# Patient Record
Sex: Male | Born: 1985 | Race: White | Hispanic: No | Marital: Married | Smoking: Current every day smoker
Health system: Southern US, Community
[De-identification: ages and names within clinical notes are randomized; demographics above are authoritative.]

## PROBLEM LIST (undated history)

## (undated) DIAGNOSIS — S8254XK Nondisplaced fracture of medial malleolus of right tibia, subsequent encounter for closed fracture with nonunion: Secondary | ICD-10-CM

## (undated) DIAGNOSIS — M25871 Other specified joint disorders, right ankle and foot: Secondary | ICD-10-CM

## (undated) DIAGNOSIS — M25371 Other instability, right ankle: Secondary | ICD-10-CM

## (undated) DIAGNOSIS — Z4789 Encounter for other orthopedic aftercare: Secondary | ICD-10-CM

## (undated) DIAGNOSIS — G709 Myoneural disorder, unspecified: Secondary | ICD-10-CM

## (undated) DIAGNOSIS — M549 Dorsalgia, unspecified: Secondary | ICD-10-CM

## (undated) DIAGNOSIS — K921 Melena: Secondary | ICD-10-CM

## (undated) DIAGNOSIS — Z87442 Personal history of urinary calculi: Secondary | ICD-10-CM

## (undated) DIAGNOSIS — G8929 Other chronic pain: Secondary | ICD-10-CM

## (undated) DIAGNOSIS — K589 Irritable bowel syndrome without diarrhea: Secondary | ICD-10-CM

## (undated) DIAGNOSIS — I10 Essential (primary) hypertension: Secondary | ICD-10-CM

## (undated) DIAGNOSIS — K635 Polyp of colon: Secondary | ICD-10-CM

## (undated) DIAGNOSIS — J45909 Unspecified asthma, uncomplicated: Secondary | ICD-10-CM

## (undated) DIAGNOSIS — K219 Gastro-esophageal reflux disease without esophagitis: Secondary | ICD-10-CM

## (undated) HISTORY — DX: Polyp of colon: K63.5

## (undated) HISTORY — DX: Gastro-esophageal reflux disease without esophagitis: K21.9

## (undated) HISTORY — PX: KNEE SURGERY: SHX244

## (undated) HISTORY — DX: Melena: K92.1

## (undated) HISTORY — PX: GALLBLADDER SURGERY: SHX652

---
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
## 2004-07-20 ENCOUNTER — Emergency Department (HOSPITAL_COMMUNITY): Admission: EM | Admit: 2004-07-20 | Discharge: 2004-07-20 | Payer: Self-pay | Admitting: Emergency Medicine

## 2004-08-29 ENCOUNTER — Emergency Department (HOSPITAL_COMMUNITY): Admission: EM | Admit: 2004-08-29 | Discharge: 2004-08-29 | Payer: Self-pay | Admitting: Emergency Medicine

## 2004-12-25 ENCOUNTER — Emergency Department (HOSPITAL_COMMUNITY): Admission: EM | Admit: 2004-12-25 | Discharge: 2004-12-25 | Payer: Self-pay | Admitting: Emergency Medicine

## 2005-05-14 ENCOUNTER — Emergency Department (HOSPITAL_COMMUNITY): Admission: EM | Admit: 2005-05-14 | Discharge: 2005-05-14 | Payer: Self-pay | Admitting: Emergency Medicine

## 2005-07-31 ENCOUNTER — Emergency Department (HOSPITAL_COMMUNITY): Admission: EM | Admit: 2005-07-31 | Discharge: 2005-07-31 | Payer: Self-pay | Admitting: Emergency Medicine

## 2005-09-05 IMAGING — CR DG SHOULDER 2+V*R*
3 series · 3 of 3 positions shown · non-contrast
Comparison: none

CLINICAL DATA: Rt arm injury.
 RIGHT SHOULDER THREE VIEWS
 Comparison none. 
 There is no evidence of an acute fracture or dislocation.  Subacromial space appears well preserved.  The acromioclavicular joint is intact.  There is lateral slope to the acromion which may put the patient at risk for impingement. 
 IMPRESSION
 No acute skeletal abnormality.  Lateral slope to the acromion which may put the patient at risk for impingement. 
 RIGHT HUMERUS TWO VIEWS
 No fracture is identified.  There are no intrinsic osseous abnormalities. 
 Normal examination. 
 RIGHT ELBOW FOUR VIEWS
 There is no evidence of an acute fracture or dislocation.  The joint spaces are well preserved.  There are no intrinsic osseous abnormalities.  There is no posterior fat pad to suggest an occult fracture. 
 Normal examination.

[view not recorded (1 of 3)]
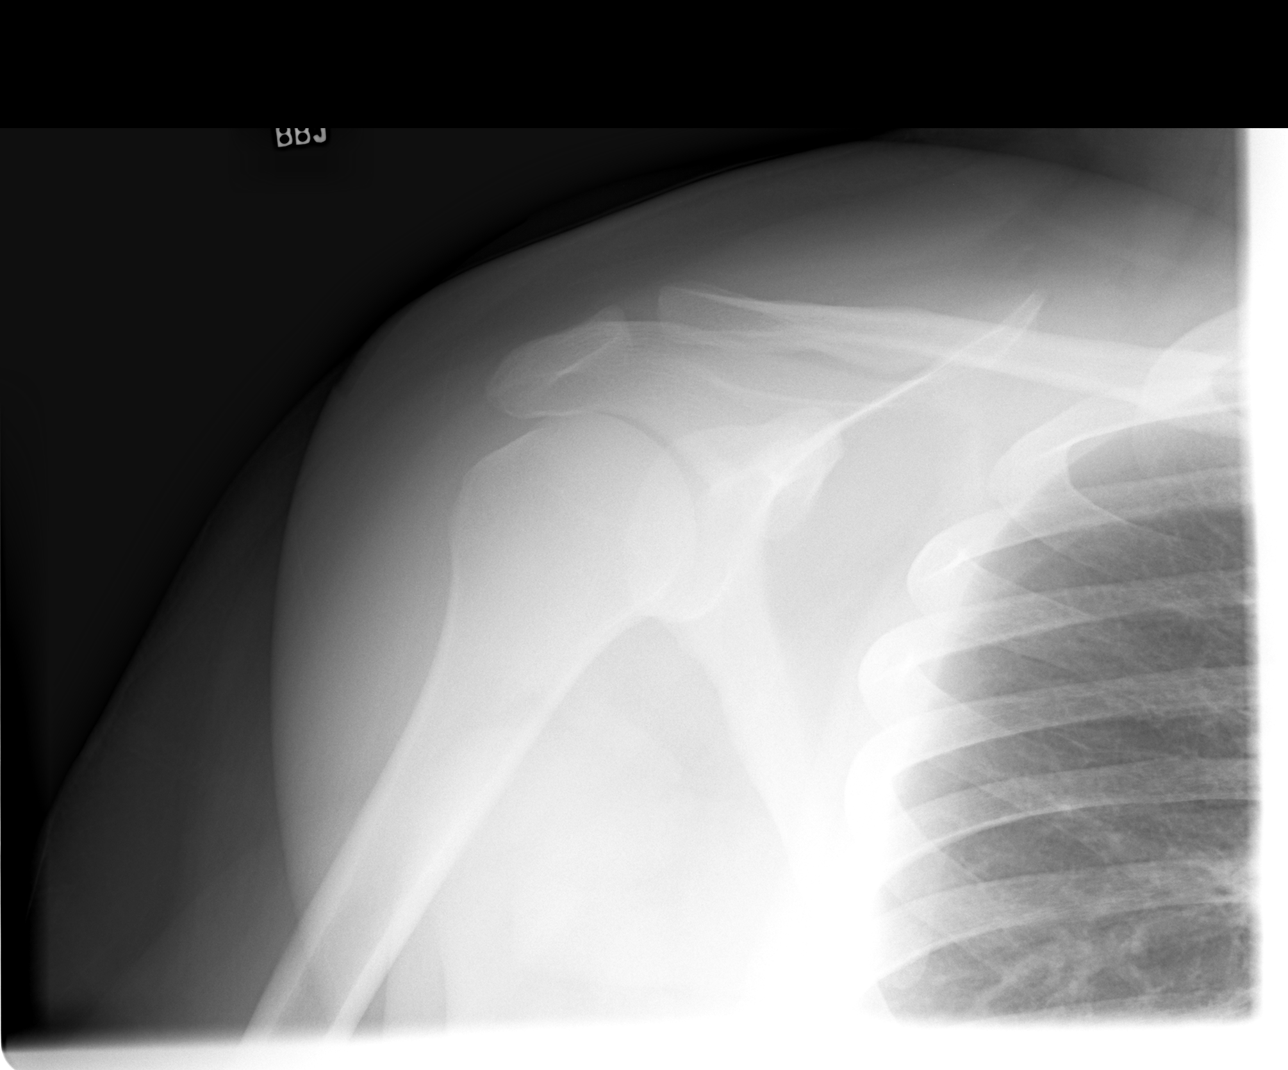

[view not recorded (2 of 3)]
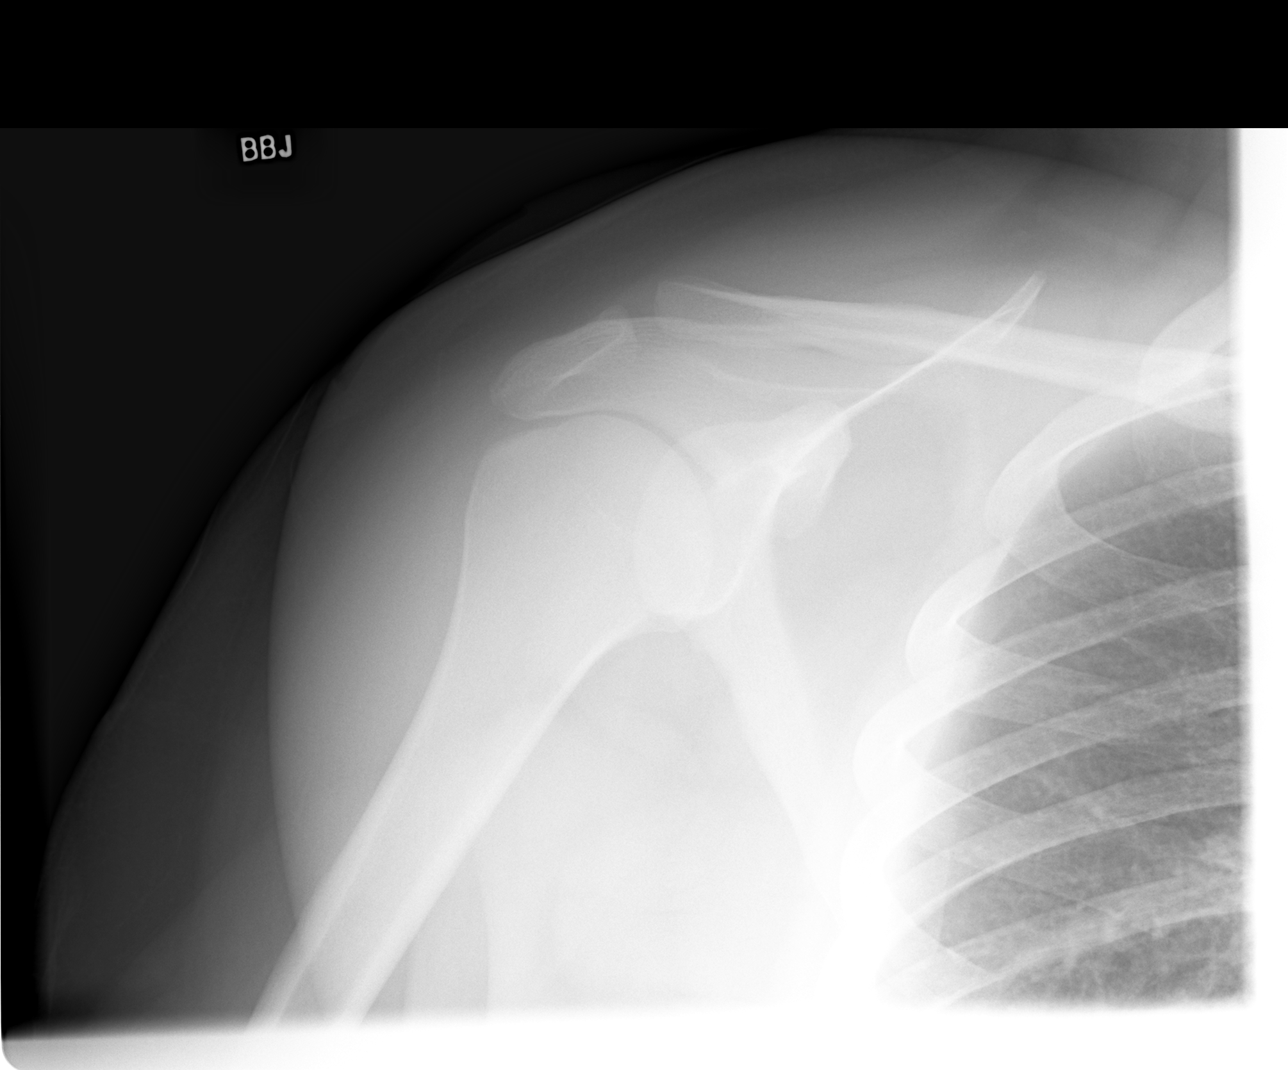

[view not recorded (3 of 3)]
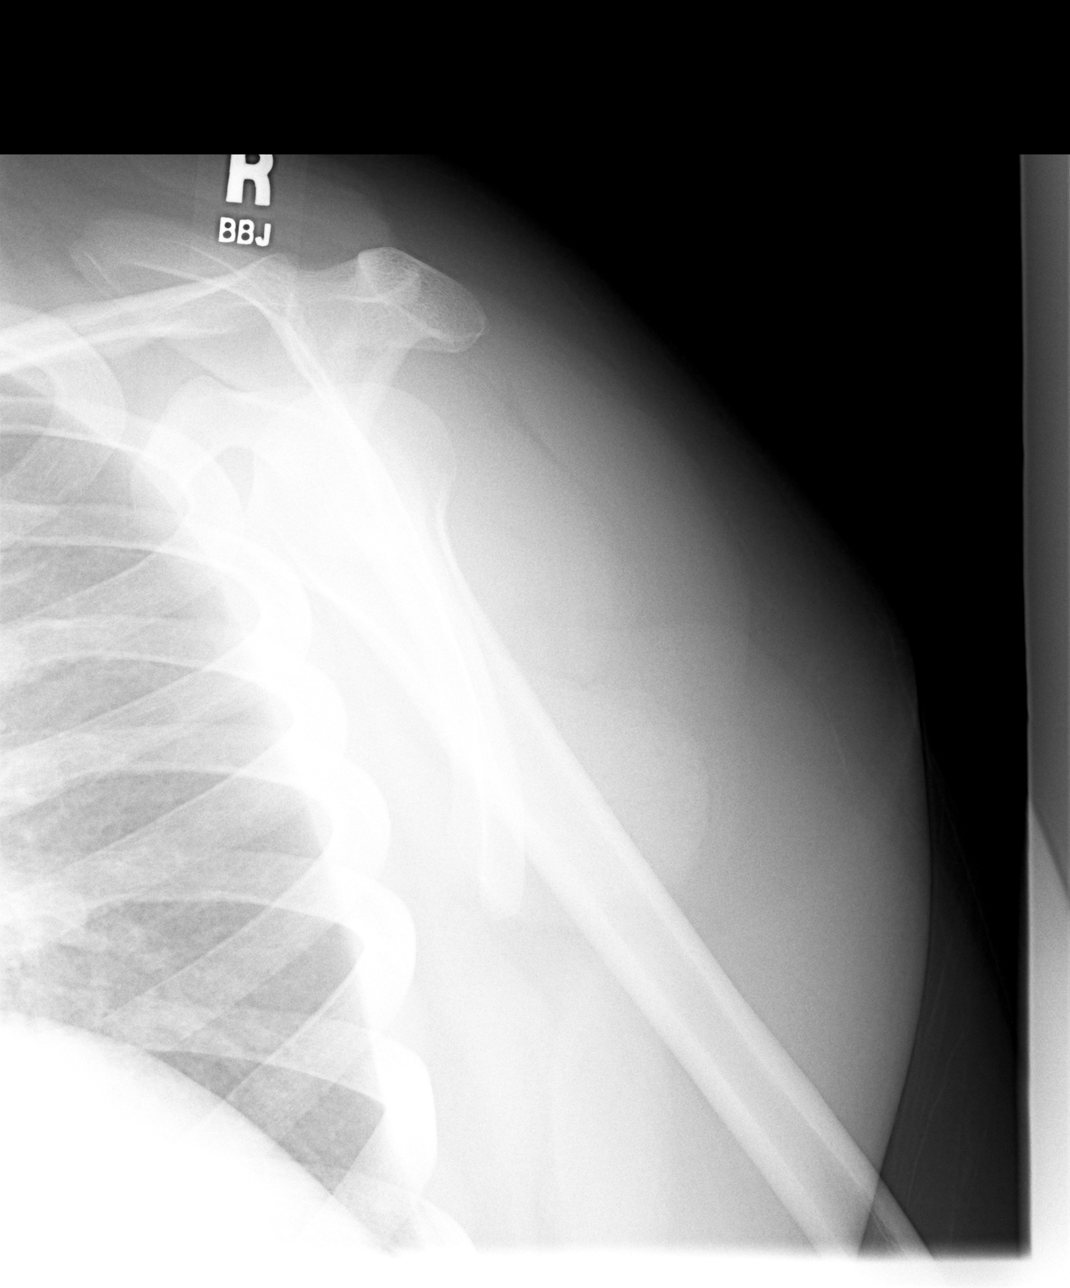

[3 of 3 positions shown; findings below may reference images not displayed]

## 2005-09-05 IMAGING — CR DG HUMERUS 2V *R*
2 series · 2 of 2 positions shown · non-contrast
Comparison: none

CLINICAL DATA: Rt arm injury.
 RIGHT SHOULDER THREE VIEWS
 Comparison none. 
 There is no evidence of an acute fracture or dislocation.  Subacromial space appears well preserved.  The acromioclavicular joint is intact.  There is lateral slope to the acromion which may put the patient at risk for impingement. 
 IMPRESSION
 No acute skeletal abnormality.  Lateral slope to the acromion which may put the patient at risk for impingement. 
 RIGHT HUMERUS TWO VIEWS
 No fracture is identified.  There are no intrinsic osseous abnormalities. 
 Normal examination. 
 RIGHT ELBOW FOUR VIEWS
 There is no evidence of an acute fracture or dislocation.  The joint spaces are well preserved.  There are no intrinsic osseous abnormalities.  There is no posterior fat pad to suggest an occult fracture. 
 Normal examination.

[view not recorded (1 of 2)]
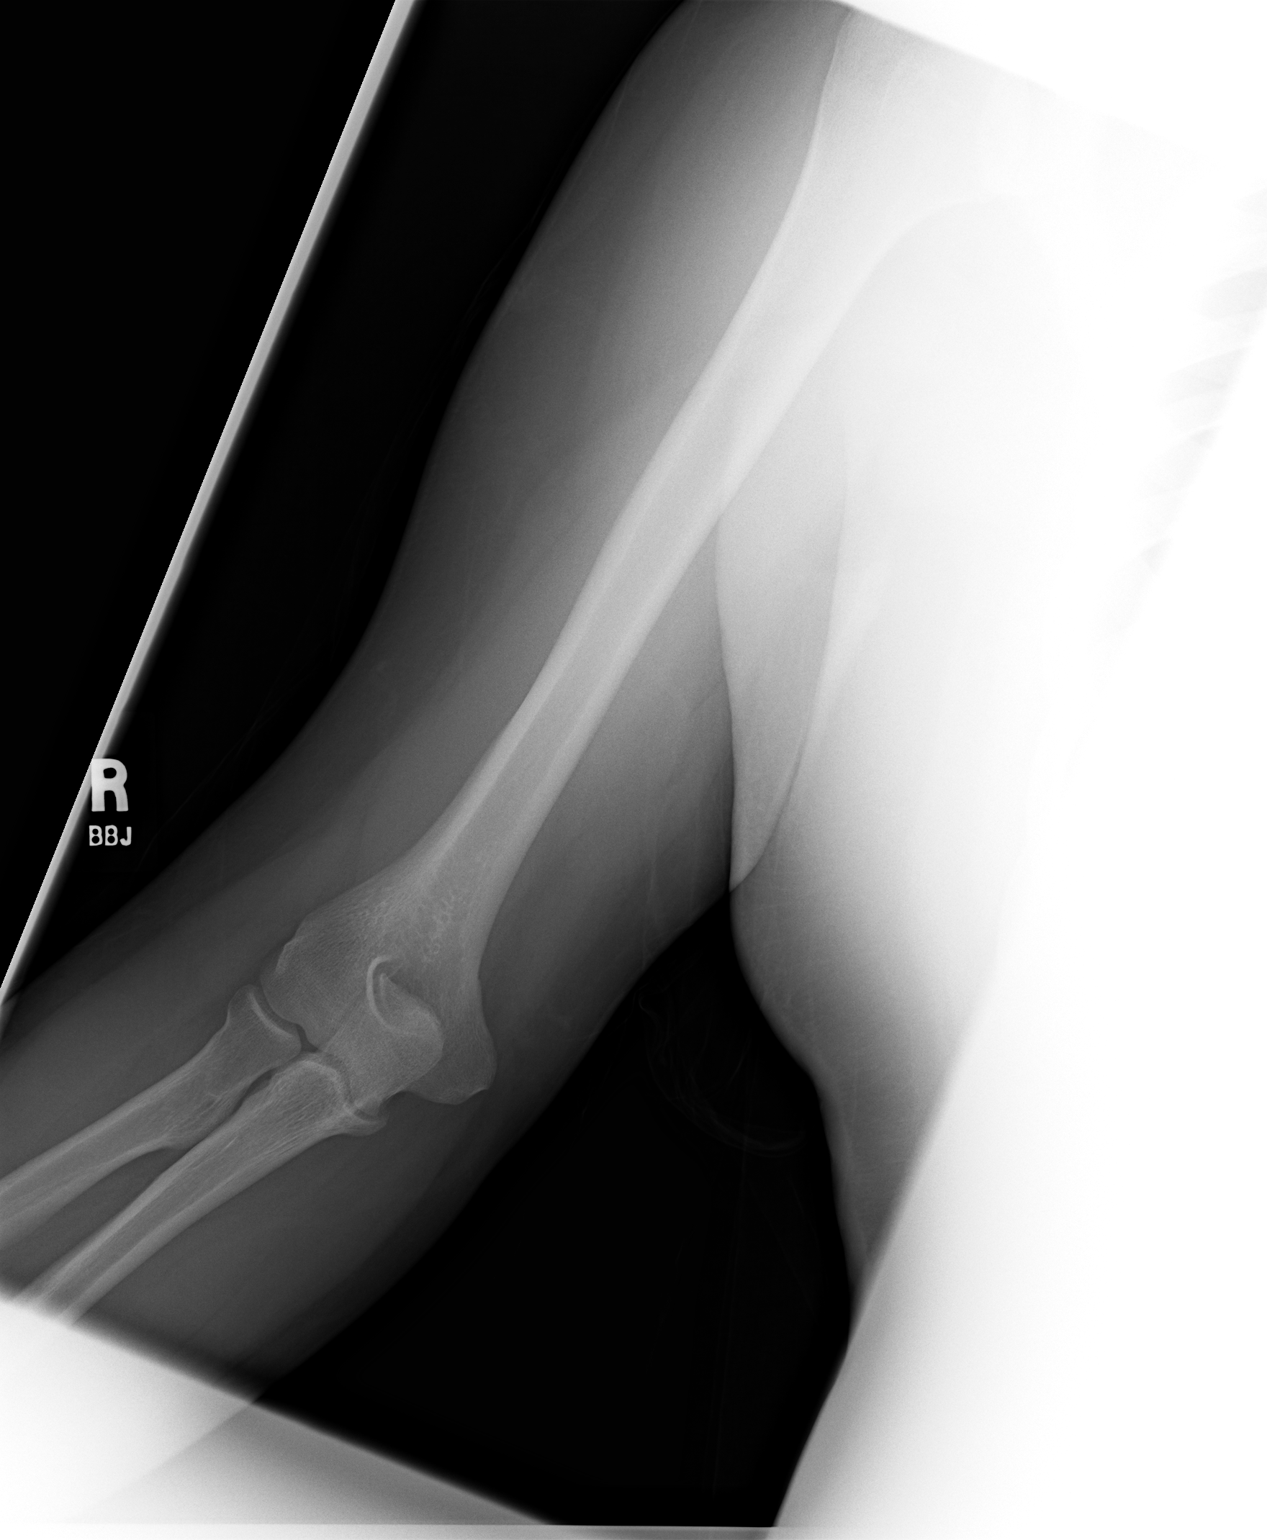

[view not recorded (2 of 2)]
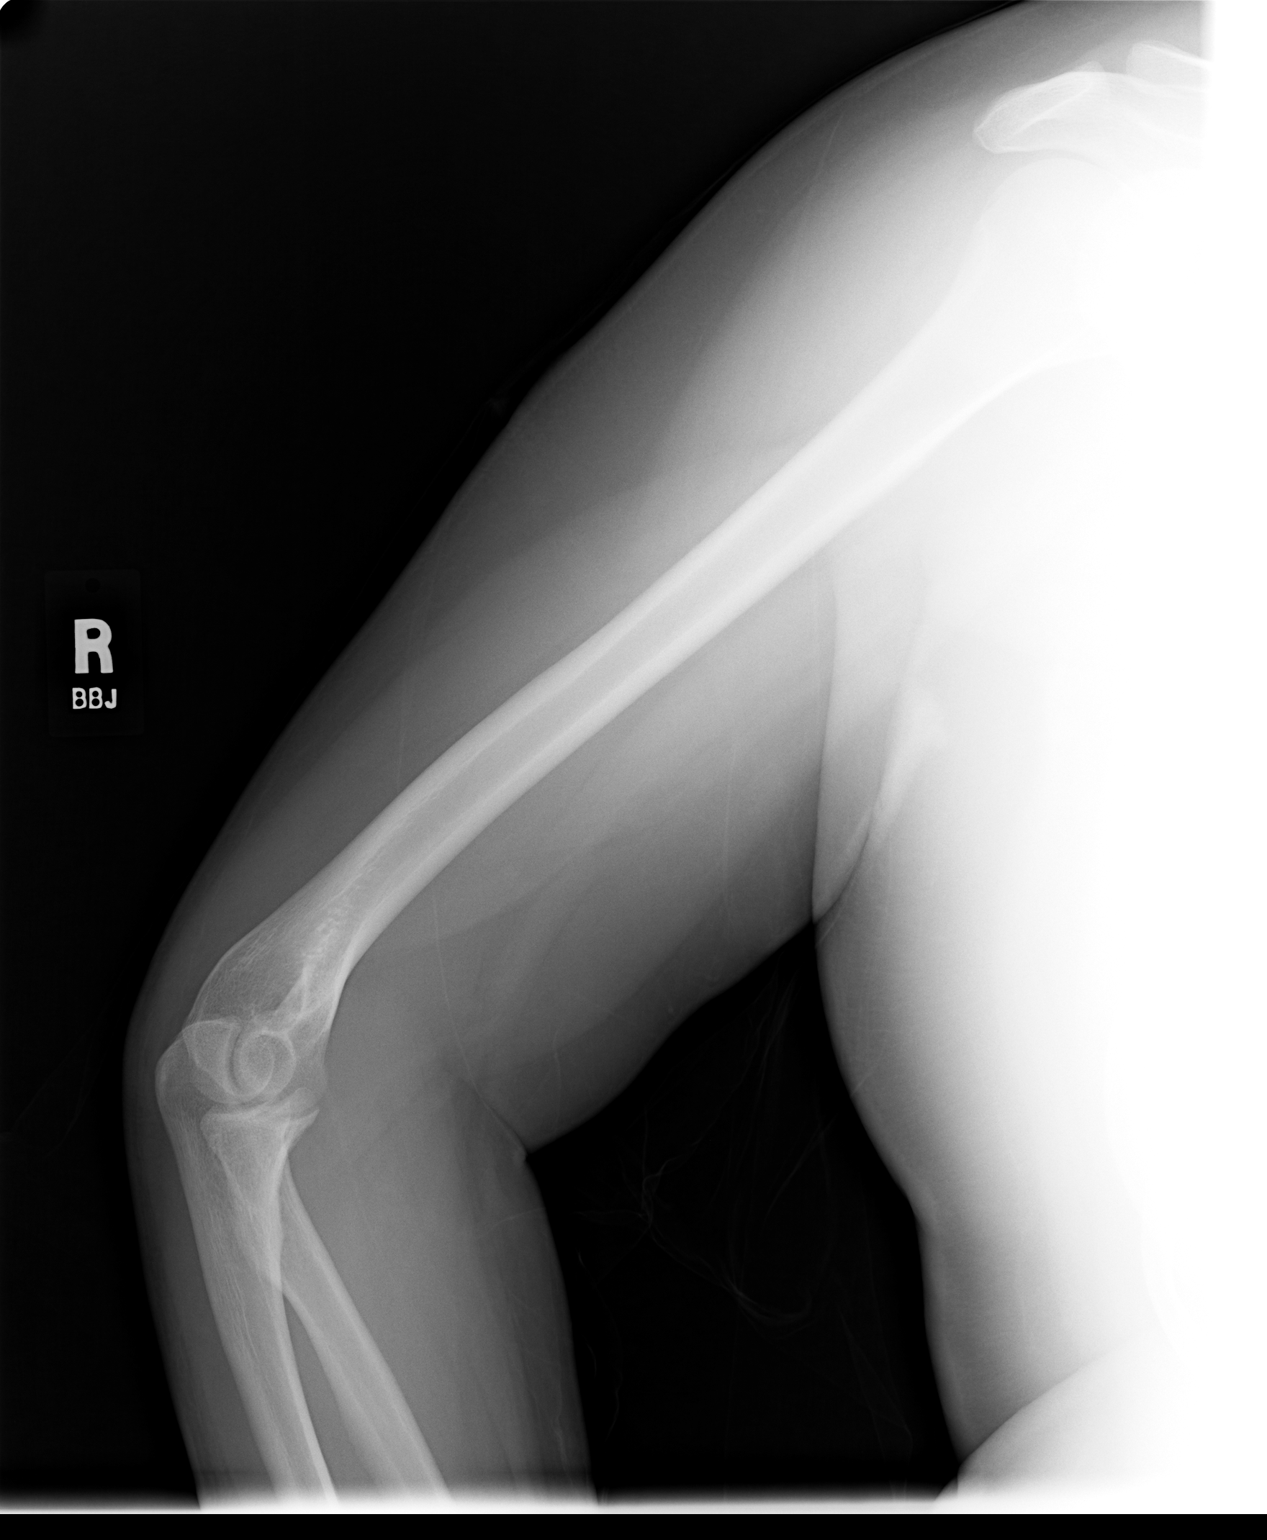

[2 of 2 positions shown; findings below may reference images not displayed]

## 2006-02-04 ENCOUNTER — Emergency Department (HOSPITAL_COMMUNITY): Admission: EM | Admit: 2006-02-04 | Discharge: 2006-02-04 | Payer: Self-pay | Admitting: Emergency Medicine

## 2006-06-08 ENCOUNTER — Emergency Department (HOSPITAL_COMMUNITY): Admission: EM | Admit: 2006-06-08 | Discharge: 2006-06-08 | Payer: Self-pay | Admitting: Emergency Medicine

## 2006-09-04 ENCOUNTER — Emergency Department (HOSPITAL_COMMUNITY): Admission: EM | Admit: 2006-09-04 | Discharge: 2006-09-04 | Payer: Self-pay | Admitting: Emergency Medicine

## 2006-10-01 ENCOUNTER — Emergency Department (HOSPITAL_COMMUNITY): Admission: EM | Admit: 2006-10-01 | Discharge: 2006-10-01 | Payer: Self-pay | Admitting: Emergency Medicine

## 2006-10-31 ENCOUNTER — Emergency Department (HOSPITAL_COMMUNITY): Admission: EM | Admit: 2006-10-31 | Discharge: 2006-10-31 | Payer: Self-pay | Admitting: Emergency Medicine

## 2007-01-04 ENCOUNTER — Emergency Department (HOSPITAL_COMMUNITY): Admission: EM | Admit: 2007-01-04 | Discharge: 2007-01-04 | Payer: Self-pay | Admitting: Emergency Medicine

## 2007-09-01 ENCOUNTER — Emergency Department (HOSPITAL_COMMUNITY): Admission: EM | Admit: 2007-09-01 | Discharge: 2007-09-01 | Payer: Self-pay | Admitting: Emergency Medicine

## 2007-12-15 ENCOUNTER — Emergency Department (HOSPITAL_COMMUNITY): Admission: EM | Admit: 2007-12-15 | Discharge: 2007-12-15 | Payer: Self-pay | Admitting: Emergency Medicine

## 2009-01-23 ENCOUNTER — Emergency Department (HOSPITAL_COMMUNITY): Admission: EM | Admit: 2009-01-23 | Discharge: 2009-01-23 | Payer: Self-pay | Admitting: Emergency Medicine

## 2009-01-24 ENCOUNTER — Ambulatory Visit: Payer: Self-pay | Admitting: Family Medicine

## 2009-01-30 ENCOUNTER — Ambulatory Visit: Payer: Self-pay | Admitting: Family Medicine

## 2009-05-04 ENCOUNTER — Emergency Department (HOSPITAL_COMMUNITY): Admission: EM | Admit: 2009-05-04 | Discharge: 2009-05-04 | Payer: Self-pay | Admitting: Emergency Medicine

## 2010-05-27 LAB — URINALYSIS, ROUTINE W REFLEX MICROSCOPIC
Glucose, UA: NEGATIVE mg/dL
Hgb urine dipstick: NEGATIVE
Specific Gravity, Urine: 1.012 (ref 1.005–1.030)

## 2010-06-05 LAB — URINALYSIS, ROUTINE W REFLEX MICROSCOPIC
Glucose, UA: NEGATIVE mg/dL
Protein, ur: NEGATIVE mg/dL
Specific Gravity, Urine: 1.02 (ref 1.005–1.030)
pH: 6.5 (ref 5.0–8.0)

## 2010-07-19 NOTE — H&P (Signed)
NAME:  Antonio Griffith, MINOTTI                         ACCOUNT NO.:  [id number]   MEDICAL RECORD NO.:  [id number]                   PATIENT TYPE:   LOCATION:                                       FACILITY:  [facility]   PHYSICIAN:  J. Darreld Mclean, M.D.              DATE OF BIRTH:  March 03, 1986   DATE OF ADMISSION:  11/09/2003  DATE OF DISCHARGE:                                HISTORY & PHYSICAL   CHIEF COMPLAINT:  Right knee pain.   HISTORY OF PRESENT ILLNESS:  The patient is an 25 year old white male who is  being admitted on November 09, 2003 to undergo right knee arthroscopy and  meniscectomy.   He was initially seen here in our office on October 31, 2003 complaining of  right knee pain.  He had a history of giving away and locking of the knee.  He also stated that he had a massive ___________ approximately six months  ago when he fell back on his knee and twisted it.  Since then he has been to  the emergency room twice and had x-rays taken.  Each of the times showed an  effusion to the knee and possibly a loose body.   Because of the giving away and locking of his knee and x-ray findings, it  was felt the patient had a meniscal tear.  He underwent an MRI of his right  knee on October 31, 2003 and did indeed show a lateral meniscal tear with  protrusion of the lateral meniscus through the joint space.  There are also  some changes noted to the anterior horn of the lateral meniscus.  There was  a complete versus a high-grade partial tear of the proximal anterior  cruciate ligament noted on the right knee.  The results of these studies  were explained to the patient using a knee model.  Recommendations was a  knee arthroscopy and meniscectomy.   I explained the procedure to the patient as well as his parents and gave him  an information booklet.  He understands that following the procedure, he  cannot squat, engage in any activity that would reaggravate the meniscus for  approximately six  weeks.  He also understands he may need a short course of  physical therapy.  The patient as well as his parents were instructed that  if he did indeed have full-thickness ACL tear, he may need a second surgery  to correct this.   PAST MEDICAL HISTORY:  The patient denies any diabetes mellitus,  hypertension, stroke, cardiac arrest, stroke problems.   PAST SURGICAL HISTORY:  None.   MEDICATIONS:  None.   ALLERGIES:  None.   LOCAL MEDICAL DOCTOR:  None.   FAMILY AND SOCIAL HISTORY:  The patient is now currently getting a GED and  does not live with his parents.  He smokes half a pack of cigarettes per  day.  He states he does  not drink alcohol.  He works at [employer]  here in [place].   REVIEW OF SYSTEMS:  Negative for this problem.   PHYSICAL EXAMINATION:  GENERAL:  Height 5 feet 6 inches tall, weight 240  pounds. Alert and oriented x 3.  VITAL SIGNS:  Pulse 110, respirations 12, blood pressure 110/70.  HEENT:  Normal.  NECK:  Supple.  No thyromegaly, masses, palpated.  LUNGS:  Clear to A&P.  HEART:  Regular rhythm.  No murmur, no cardiomegaly.  ABDOMEN:  Protuberant, obese, soft, and nontender.  No organomegaly or  masses palpated.  Hyperactive bowel sounds are auscultated.  EXTREMITIES:  There is effusion to the right knee with marked pain and  tenderness to the lateral joint line on palpation.  There is no medial or  lateral collateral ligament instability noted. Negative anterior drawer  sign.  Neurovascular is intact.  Gait was normal.  The other extremity was normal  and neurovascular intact.  CNS:  Intact.  SKIN:  Intact.   IMPRESSION:  Torn posterior horn lateral meniscus with protrusion through  the knee joint.  Complete versus partial tear to the anterior cruciate  ligament, right knee.  Exogenous obesity.   PLAN:  The patient is to be admitted to day hospital on November 09, 2003 to  undergo right knee arthroscopy and meniscectomy.  Labs are  pending.     _____________________________________  ___________________________________________  Candace Cruise, Doylene Bode, M.D.   BB/MEDQ  D:  11/02/2003  T:  11/02/2003  Job:  [id number]

## 2010-07-19 NOTE — Op Note (Signed)
NAME:  Antonio Griffith, Antonio Griffith                         ACCOUNT NO.:  [id number]   MEDICAL RECORD NO.:  [id number]                   PATIENT TYPE:  AMB   LOCATION:  DAY                                  FACILITY:  [facility]   PHYSICIAN:  J. Darreld Mclean, M.D.              DATE OF BIRTH:  04-Jan-1986   DATE OF PROCEDURE:  11/09/2003  DATE OF DISCHARGE:                                 OPERATIVE REPORT   PREOPERATIVE DIAGNOSIS:  Tear lateral meniscus, right knee.   POSTOPERATIVE DIAGNOSIS:  Tear medial meniscus, right knee.   PROCEDURE:  Operative arthroscopy, partial medial meniscectomy.   ANESTHESIA:  General.   SURGEON:  J. Darreld Mclean, M.D.   ASSISTANT:  Candace Cruise, P.A.-C.   TOURNIQUET TIME:  22 minutes.   DRAINS:  No drains.   INDICATIONS:  The patient is an 25 year old male with pain and tenderness in  his right knee that has been getting worse. A MRI showed a tear of the  lateral meniscus and a partial tear of the anterior cruciate ligament. The  patient has not improved with conservative treatment. Surgery is  recommended. Risks and imponderables were discussed.   OPERATIVE FINDINGS:  Suprapatellar pouch looked normal, __________ patella  looked normal. Medially, there was a tear in the posterior horn of the  medical meniscus. The cartilage looked normal. Anterior cruciate was intact  laterally. There was no tear laterally, and the articular surface looked  normal. Gutters were negative. No loose bodies.   DESCRIPTION OF PROCEDURE:  The patient was seen in the holding area. The  right knee was identified at the correct surgical site. I placed an initial  over the surgical wound area. The patient had already placed his mark. The  patient was brought back to the operating room and given general anesthesia.  Leg holder and tourniquet placed deflated on the right upper thigh. The  patient was prepped and draped in the usual manner. Had a time out  identifying the right knee as the  correct surgical site. The leg was  elevated and wrapped circumferentially with Esmarch bandage, tourniquet  inflated 300 mmHg, and Esmarch bandage removed. Inflow cannula inserted.  Lactated ringers instilled in the knee by an effusion pump. Arthroscope  inserted laterally. Arthroscope systematically used around the knee. Please  see findings above. It should be noted on the MRI said there was a lateral  tear; lateral meniscus was normal. MRI said possible incomplete or complete  tear of the anterior cruciate; anterior cruciate looked normal. There was a  tear in the posterior horn of the medial meniscus. Using a meniscal shaver  and meniscal punch, a smooth contour was obtained after removing the tear.  Knee was systematically reexamined; no new pathology found. Particularly  went and looked laterally again and did not appreciate a tear here. Anterior  cruciate was intact. He was irrigated with remaining lactated ringers. Wound  was reapproximated using  3-0 nylon interrupted vertical mattress.  Pneumatic  tourniquet was deflated after 22 minutes. Marcaine 0.25% instilled in each  portal. A sterile dressing applied. A bulky dressing applied. Knee  immobilizer. The patient tolerated the procedure well and went to recovery  in good condition. Prescription for Vicodin ES given for pain. I will see in  the office in approximately 10 days to 2 weeks. Any difficulties, contact us  through provided numbers.      ___________________________________________                                            Antonio Griffith, M.D.   JWK/MEDQ  D:  11/09/2003  T:  11/09/2003  Job:  [id number]

## 2011-05-14 ENCOUNTER — Emergency Department (HOSPITAL_COMMUNITY)
Admission: EM | Admit: 2011-05-14 | Discharge: 2011-05-14 | Disposition: A | Discharge status: Home / Self Care | Payer: BC Managed Care – PPO | Attending: Emergency Medicine | Admitting: Emergency Medicine

## 2011-05-14 ENCOUNTER — Encounter (HOSPITAL_COMMUNITY): Payer: Self-pay | Admitting: *Deleted

## 2011-05-14 ENCOUNTER — Emergency Department (HOSPITAL_COMMUNITY): Payer: BC Managed Care – PPO

## 2011-05-14 DIAGNOSIS — M79609 Pain in unspecified limb: Secondary | ICD-10-CM | POA: Insufficient documentation

## 2011-05-14 DIAGNOSIS — M25539 Pain in unspecified wrist: Secondary | ICD-10-CM | POA: Insufficient documentation

## 2011-05-14 DIAGNOSIS — W010XXA Fall on same level from slipping, tripping and stumbling without subsequent striking against object, initial encounter: Secondary | ICD-10-CM | POA: Insufficient documentation

## 2011-05-14 DIAGNOSIS — R609 Edema, unspecified: Secondary | ICD-10-CM | POA: Insufficient documentation

## 2011-05-14 DIAGNOSIS — S63509A Unspecified sprain of unspecified wrist, initial encounter: Secondary | ICD-10-CM | POA: Insufficient documentation

## 2011-05-14 DIAGNOSIS — M25439 Effusion, unspecified wrist: Secondary | ICD-10-CM | POA: Insufficient documentation

## 2011-05-14 DIAGNOSIS — Y93E1 Activity, personal bathing and showering: Secondary | ICD-10-CM | POA: Insufficient documentation

## 2011-05-14 MED ORDER — IBUPROFEN 800 MG PO TABS
800.0000 mg | ORAL_TABLET | Freq: Once | ORAL | Status: AC
  Administered 2011-05-14: 800 mg via ORAL
  Filled 2011-05-14: qty 1

## 2011-05-14 MED ORDER — IBUPROFEN 800 MG PO TABS: 800.0000 mg | ORAL_TABLET | Freq: Three times a day (TID) | ORAL | Status: AC

## 2011-05-14 MED ORDER — HYDROCODONE-ACETAMINOPHEN 5-325 MG PO TABS: ORAL_TABLET | ORAL | Status: AC

## 2011-05-14 MED ORDER — HYDROCODONE-ACETAMINOPHEN 5-325 MG PO TABS
1.0000 | ORAL_TABLET | Freq: Once | ORAL | Status: AC
  Administered 2011-05-14: 1 via ORAL
  Filled 2011-05-14: qty 1

## 2011-05-14 NOTE — ED Notes (Signed)
Patient transported to X-ray 

## 2011-05-14 NOTE — ED Provider Notes (Signed)
History     CSN: [id number]  Arrival date & time 05/14/11  2150   First MD Initiated Contact with Patient 05/14/11 2220      Chief Complaint  Patient presents with  . Fall    (Consider location/radiation/quality/duration/timing/severity/associated sxs/prior treatment) HPI Comments: Patient c/o pain to his right wrist and forearm that began after he slipped and fell in the shower.  States his wrist struck the edge of the tub when he fell.  He denies other injuries, headache, dizziness, vomiting or LOC.  Patient is a 26 y.o. male presenting with wrist pain. The history is provided by the patient. No language interpreter was used.  Wrist Pain This is a new problem. Episode onset: just prior to ED arrival. The problem occurs constantly. The problem has been unchanged. Associated symptoms include arthralgias and joint swelling. Pertinent negatives include no abdominal pain, chest pain, fever, headaches, nausea, neck pain, numbness, vomiting or weakness. Associated symptoms comments: No head injury, neck pain or LOC. Exacerbated by: movement and palpation. He has tried ice for the symptoms. The treatment provided mild relief.    History reviewed. No pertinent past medical history.  Past Surgical History  Procedure Date  . Knee surgery     History reviewed. No pertinent family history.  History  Substance Use Topics  . Smoking status: Current Everyday Smoker  . Smokeless tobacco: Not on file  . Alcohol Use: Yes      Review of Systems  Constitutional: Negative for fever.  HENT: Negative for facial swelling and neck pain.   Cardiovascular: Negative for chest pain.  Gastrointestinal: Negative for nausea, vomiting and abdominal pain.  Genitourinary: Negative for hematuria and flank pain.  Musculoskeletal: Positive for joint swelling and arthralgias. Negative for back pain.  Skin: Negative.   Neurological: Negative for dizziness, weakness, numbness and headaches.  All other  systems reviewed and are negative.    Allergies  Review of patient's allergies indicates no known allergies.  Home Medications   Current Outpatient Rx  Name Route Sig Dispense Refill  . OXYMETAZOLINE HCL 0.05 % NA SOLN Nasal Place 2 sprays into the nose 2 (two) times daily.      BP 147/93  Pulse 84  Temp(Src) 97.9 F (36.6 C) (Oral)  Resp 16  Ht 5\' 9"  (1.753 m)  Wt 310 lb (140.615 kg)  BMI 45.78 kg/m2  SpO2 94%  Physical Exam  Nursing note and vitals reviewed. Constitutional: He is oriented to person, place, and time. He appears well-developed and well-nourished. No distress.  HENT:  Head: Normocephalic and atraumatic.  Mouth/Throat: Oropharynx is clear and moist.  Eyes: EOM are normal.  Neck: Normal range of motion. Neck supple.  Cardiovascular: Normal rate, regular rhythm, normal heart sounds and intact distal pulses.   No murmur heard. Pulmonary/Chest: Effort normal and breath sounds normal. No respiratory distress. He exhibits no tenderness.  Abdominal: Soft. He exhibits no distension. There is no tenderness.  Musculoskeletal: Normal range of motion. He exhibits edema and tenderness.       Right wrist: He exhibits tenderness, bony tenderness and swelling. He exhibits normal range of motion, no effusion, no crepitus, no deformity and no laceration.       Arms:      Diffuse ttp of the right wrist.  Mild STS, sensation intact, radial pulse is brisk, cr<2 sec.  Elbow is NT. Pain to wrist  is reproduced with flexion  Lymphadenopathy:    He has no cervical adenopathy.  Neurological: He is  alert and oriented to person, place, and time. He exhibits normal muscle tone. Coordination normal.  Skin: Skin is warm and dry.    ED Course  Procedures (including critical care time)  Labs Reviewed - No data to display Dg Wrist Complete Right  05/14/2011  *RADIOLOGY REPORT*  Clinical Data: Right hand and wrist pain status post fall.  RIGHT WRIST - COMPLETE 3+ VIEW  Comparison:  Contemporaneous hand  Findings: No acute fracture or dislocation.  No aggressive osseous lesion.  IMPRESSION: No acute osseous abnormality identified.  Original Report Authenticated By: Waneta Martins, M.D.   Dg Hand Complete Right  05/14/2011  *RADIOLOGY REPORT*  Clinical Data: Right hand and wrist pain status post fall.  RIGHT HAND - COMPLETE 3+ VIEW  Comparison: Contemporaneous wrist  Findings: No displaced fracture or dislocation identified.  No aggressive osseous lesion.  IMPRESSION: No acute osseous abnormality identified.  If clinical concern for a fracture persists, recommend a repeat radiograph in 5-10 days to evaluate for interval change or callus formation.  Original Report Authenticated By: Waneta Martins, M.D.    velcro forearm splint applied.  Pain improved.  Remains NV intact.      MDM    ttp of the right wrist , w/o obvious deformity.  NV is intact.  No abrasion, bruising or erythema.  Pt agrees to elevate and ice, I will give referral for orthopedic f/u and pt agrees to care plan.  Patient / Family / Caregiver understand and agree with initial ED impression and plan with expectations set for ED visit. Pt stable in ED with no significant deterioration in condition. Pt feels improved after observation and/or treatment in ED.        Bayli Quesinberry L. [place], [place] 05/17/11 2131

## 2011-05-14 NOTE — Discharge Instructions (Signed)
Sprain  A sprain happens when the bands of tissue that connect bones and hold joints together (ligaments) stretch too much or tear.  HOME CARE   Raise (elevate) the injured area to lessen puffiness (swelling).   Put ice on the injured area.   Put ice in a plastic bag.   Place a towel between your skin and the bag.   Leave the ice on for 15 to 20 minutes, 3 to 4 times a day.   Do this for the first 24 hours or as told by your doctor.   Wear any splints, braces, castings, or elastic wraps as told by your child's doctor.   Eat healthy foods.   Only take medicine as told by your doctor.  GET HELP RIGHT AWAY IF:    There is numbness or tingling in the injured limb.   The toes or fingers become blue or white in the injured limb.   The sprained limb is cold to the touch.   There is a sharp, shooting pain in the injured limb.   The puffiness is getting worse instead of better.  MAKE SURE YOU:    Understand these instructions.   Will watch this condition.   Will get help right away if you are not doing well or get worse.  Document Released: 08/06/2007 Document Revised: 02/06/2011 Document Reviewed: 01/03/2009  [employer] Patient Information 2012 [employer].

## 2011-05-14 NOTE — ED Notes (Signed)
PA at bedside to evaluate.

## 2011-05-14 NOTE — ED Notes (Signed)
Slipped in shower.  Pain rt hand, wrist, forearm.

## 2011-05-14 NOTE — ED Notes (Signed)
Patient back to room from radiology. Into room to assess. Slight swelling to right hand. No deformity to fingers or extremity. Strong right radial pulse with brisk cap refill. Able to wiggle fingers. Denies any needs at this time. Call bell within reach. Friend with patient. Awaiting evaluation.

## 2011-05-18 NOTE — ED Provider Notes (Signed)
Medical screening examination/treatment/procedure(s) were performed by non-physician practitioner and as supervising physician I was immediately available for consultation/collaboration.   Kathreen Dileo L Stephani Janak, MD 05/18/11 0707 

## 2011-07-13 ENCOUNTER — Encounter (HOSPITAL_COMMUNITY): Payer: Self-pay | Admitting: *Deleted

## 2011-07-13 ENCOUNTER — Emergency Department (HOSPITAL_COMMUNITY)
Admission: EM | Admit: 2011-07-13 | Discharge: 2011-07-13 | Disposition: A | Discharge status: Home / Self Care | Payer: BC Managed Care – PPO | Attending: Emergency Medicine | Admitting: Emergency Medicine

## 2011-07-13 DIAGNOSIS — R110 Nausea: Secondary | ICD-10-CM | POA: Insufficient documentation

## 2011-07-13 DIAGNOSIS — R109 Unspecified abdominal pain: Secondary | ICD-10-CM | POA: Insufficient documentation

## 2011-07-13 DIAGNOSIS — F17200 Nicotine dependence, unspecified, uncomplicated: Secondary | ICD-10-CM | POA: Insufficient documentation

## 2011-07-13 LAB — COMPREHENSIVE METABOLIC PANEL
ALT: 46 U/L (ref 0–53)
AST: 19 U/L (ref 0–37)
CO2: 25 mEq/L (ref 19–32)
Chloride: 105 mEq/L (ref 96–112)
GFR calc non Af Amer: >90 mL/min (ref >90)
Sodium: 139 mEq/L (ref 135–145)
Total Bilirubin: 0.3 mg/dL (ref 0.3–1.2)

## 2011-07-13 MED ORDER — GI COCKTAIL ~~LOC~~
30.0000 mL | Freq: Once | ORAL | Status: AC
  Administered 2011-07-13: 30 mL via ORAL
  Filled 2011-07-13: qty 30

## 2011-07-13 MED ORDER — OMEPRAZOLE 20 MG PO CPDR: 20.0000 mg | DELAYED_RELEASE_CAPSULE | Freq: Every day | ORAL | Status: DC

## 2011-07-13 NOTE — ED Notes (Signed)
Pt states abdominal pain since October, described as "ripping, tearing" gas pains to upper abdomen, sometimes radiating to back. Pain is worse after eating. Pt states he has tried gas-x and senokot with no relief. Pt has not been seen for this problem previously.

## 2011-07-13 NOTE — Discharge Instructions (Signed)

## 2011-07-13 NOTE — ED Provider Notes (Signed)
History     CSN: [id number]  Arrival date & time 07/13/11  1239   First MD Initiated Contact with Patient 07/13/11 1302      Chief Complaint  Patient presents with  . Abdominal Pain    Patient is a 26 y.o. male presenting with abdominal pain. The history is provided by the patient.  Abdominal Pain The primary symptoms of the illness include abdominal pain and nausea. The primary symptoms of the illness do not include fever, fatigue, shortness of breath, vomiting, diarrhea or hematemesis. The current episode started more than 2 days ago. The onset of the illness was gradual. The problem has been gradually worsening.  The illness is associated with eating (lying flat). Symptoms associated with the illness do not include chills, diaphoresis or frequency.  Pt reports abd pain for months Report worse with eating and lying flat, located in epigastric region and will radiate to back, reports it is a "gas" pain No cp/sob No vomiting No dysuria Has tried sennokot for constipation (has normal BM everyday, no melena) but still has gas pain.  Has also tried gas-x. Denies ETOH use  PMH - none No surgeries in past Past Surgical History  Procedure Date  . Knee surgery     No family history on file.  History  Substance Use Topics  . Smoking status: Current Everyday Smoker  . Smokeless tobacco: Not on file  . Alcohol Use: Yes      Review of Systems  Constitutional: Negative for fever, chills, diaphoresis and fatigue.  Respiratory: Negative for shortness of breath.   Gastrointestinal: Positive for nausea and abdominal pain. Negative for vomiting, diarrhea and hematemesis.  Genitourinary: Negative for frequency.  All other systems reviewed and are negative.    Allergies  Review of patient's allergies indicates no known allergies.  Home Medications   Current Outpatient Rx  Name Route Sig Dispense Refill  . OXYMETAZOLINE HCL 0.05 % NA SOLN Nasal Place 2 sprays into the nose 2  (two) times daily.      BP 139/87  Pulse 92  Temp(Src) 97.7 F (36.5 C) (Oral)  Resp 16  Ht 5\' 9"  (1.753 m)  Wt 310 lb (140.615 kg)  BMI 45.78 kg/m2  SpO2 97%  Physical Exam CONSTITUTIONAL: Well developed/well nourished HEAD AND FACE: Normocephalic/atraumatic EYES: EOMI/PERRL, no scleral icterus ENMT: Mucous membranes moist NECK: supple no meningeal signs SPINE:entire spine nontender CV: S1/S2 noted, no murmurs/rubs/gallops noted LUNGS: Lungs are clear to auscultation bilaterally, no apparent distress ABDOMEN: soft, nontender, no rebound or guarding, +BS GU:no cva tenderness NEURO: Pt is awake/alert, moves all extremitiesx4 EXTREMITIES: pulses normal, full ROM SKIN: warm, color normal PSYCH: no abnormalities of mood noted  ED Course  Procedures   Labs Reviewed  COMPREHENSIVE METABOLIC PANEL  LIPASE, BLOOD  2:36 PM Pt with pain on/off for months Doubt acute process, start PPI and outpatient referral  The patient appears reasonably screened and/or stabilized for discharge and I doubt any other medical condition or other [facility] requiring further screening, evaluation, or treatment in the ED at this time prior to discharge.    MDM  Nursing notes reviewed and considered in documentation All labs/vitals reviewed and considered         Joya Gaskins, MD 07/13/11 1437

## 2011-07-13 NOTE — ED Notes (Signed)
Pt c/o epigastric pain that radiates to his left upper abdomen around to his back. Denies nausea, vomiting or diarrhea. States that he has had constant pain since October. Alert and oriented x 3. Skin warm and dry. Color pink. Breath sounds clear and equal bilaterally.

## 2011-07-22 ENCOUNTER — Emergency Department (HOSPITAL_COMMUNITY): Payer: BC Managed Care – PPO

## 2011-07-22 ENCOUNTER — Emergency Department (HOSPITAL_COMMUNITY)
Admission: EM | Admit: 2011-07-22 | Discharge: 2011-07-22 | Disposition: A | Discharge status: Home / Self Care | Payer: BC Managed Care – PPO | Attending: Emergency Medicine | Admitting: Emergency Medicine

## 2011-07-22 ENCOUNTER — Encounter (HOSPITAL_COMMUNITY): Payer: Self-pay | Admitting: *Deleted

## 2011-07-22 DIAGNOSIS — IMO0002 Reserved for concepts with insufficient information to code with codable children: Secondary | ICD-10-CM | POA: Insufficient documentation

## 2011-07-22 DIAGNOSIS — S161XXA Strain of muscle, fascia and tendon at neck level, initial encounter: Secondary | ICD-10-CM

## 2011-07-22 DIAGNOSIS — R079 Chest pain, unspecified: Secondary | ICD-10-CM | POA: Insufficient documentation

## 2011-07-22 DIAGNOSIS — F17200 Nicotine dependence, unspecified, uncomplicated: Secondary | ICD-10-CM | POA: Insufficient documentation

## 2011-07-22 DIAGNOSIS — M542 Cervicalgia: Secondary | ICD-10-CM | POA: Insufficient documentation

## 2011-07-22 DIAGNOSIS — S46919A Strain of unspecified muscle, fascia and tendon at shoulder and upper arm level, unspecified arm, initial encounter: Secondary | ICD-10-CM

## 2011-07-22 DIAGNOSIS — S139XXA Sprain of joints and ligaments of unspecified parts of neck, initial encounter: Secondary | ICD-10-CM | POA: Insufficient documentation

## 2011-07-22 DIAGNOSIS — M25519 Pain in unspecified shoulder: Secondary | ICD-10-CM | POA: Insufficient documentation

## 2011-07-22 MED ORDER — MELOXICAM 7.5 MG PO TABS: ORAL_TABLET | ORAL | Status: DC

## 2011-07-22 MED ORDER — DIAZEPAM 5 MG PO TABS
5.0000 mg | ORAL_TABLET | Freq: Once | ORAL | Status: AC
  Administered 2011-07-22: 5 mg via ORAL
  Filled 2011-07-22: qty 1

## 2011-07-22 MED ORDER — HYDROCODONE-ACETAMINOPHEN 7.5-325 MG PO TABS: 1.0000 | ORAL_TABLET | ORAL | Status: AC | PRN

## 2011-07-22 MED ORDER — PROMETHAZINE HCL 12.5 MG PO TABS
12.5000 mg | ORAL_TABLET | Freq: Once | ORAL | Status: AC
  Administered 2011-07-22: 12.5 mg via ORAL
  Filled 2011-07-22: qty 1

## 2011-07-22 MED ORDER — HYDROCODONE-ACETAMINOPHEN 5-325 MG PO TABS
2.0000 | ORAL_TABLET | Freq: Once | ORAL | Status: AC
  Administered 2011-07-22: 2 via ORAL
  Filled 2011-07-22: qty 2

## 2011-07-22 MED ORDER — METHOCARBAMOL 500 MG PO TABS: ORAL_TABLET | ORAL | Status: DC
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
## 2011-07-25 NOTE — ED Provider Notes (Signed)
Medical screening examination/treatment/procedure(s) were performed by non-physician practitioner and as supervising physician I was immediately available for consultation/collaboration. Devoria Albe, MD, FACEPWard Givens, MD 07/25/11 [phone]

## 2011-10-15 ENCOUNTER — Encounter (HOSPITAL_COMMUNITY): Payer: Self-pay | Admitting: *Deleted

## 2011-10-15 ENCOUNTER — Emergency Department (HOSPITAL_COMMUNITY)
Admission: EM | Admit: 2011-10-15 | Discharge: 2011-10-15 | Disposition: A | Discharge status: Home / Self Care | Payer: BC Managed Care – PPO | Attending: Emergency Medicine | Admitting: Emergency Medicine

## 2011-10-15 DIAGNOSIS — F17200 Nicotine dependence, unspecified, uncomplicated: Secondary | ICD-10-CM | POA: Insufficient documentation

## 2011-10-15 DIAGNOSIS — H209 Unspecified iridocyclitis: Secondary | ICD-10-CM

## 2011-10-15 MED ORDER — HYDROCODONE-ACETAMINOPHEN 5-325 MG PO TABS: ORAL_TABLET | ORAL | Status: AC

## 2011-10-15 MED ORDER — TOBRAMYCIN 0.3 % OP SOLN
1.0000 [drp] | OPHTHALMIC | Status: DC
  Administered 2011-10-15: 1 [drp] via OPHTHALMIC
  Filled 2011-10-15: qty 5

## 2011-10-15 MED ORDER — HOMATROPINE HBR 2 % OP SOLN
1.0000 [drp] | Freq: Once | OPHTHALMIC | Status: DC
  Filled 2011-10-15: qty 5

## 2011-10-15 MED ORDER — FLUORESCEIN SODIUM 1 MG OP STRP
1.0000 | ORAL_STRIP | Freq: Once | OPHTHALMIC | Status: AC
  Administered 2011-10-15: 1 via OPHTHALMIC
  Filled 2011-10-15: qty 1

## 2011-10-15 NOTE — ED Notes (Signed)
Patient able to follow finger with bilateral eyes without incident. Patient able to use cell phone without incident and has a steady gait. Pain 5/10 burning left eye.

## 2011-10-15 NOTE — ED Provider Notes (Signed)
History   This chart was scribed for Antonio Griffith. Oletta Lamas, MD by Charolett Bumpers . The patient was seen in room TR02C/TR02C. Patient's care was started at 1135.    CSN: [id number]  Arrival date & time 10/15/11  9147   First MD Initiated Contact with Patient 10/15/11 1135      Chief Complaint  Patient presents with  . Eye Injury    (Consider location/radiation/quality/duration/timing/severity/associated sxs/prior treatment) HPI Antonio Griffith is a 26 y.o. male who presents to the Emergency Department complaining of constant, moderate eye pain with an onset of this morning. 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 Pt reports associated redness, swelling and blurred vision. Pt is unsure if a foreign body got into his eye. Pt denies wearing glasses/contacts. Pt denies any aggravation of symptoms with movement of his eyes. Pt denies any h/o seasonal allergies. Pt reports that he is otherwise healthy and denies taking any regular medications.   History reviewed. No pertinent past medical history.  Past Surgical History  Procedure Date  . Knee surgery     History reviewed. No pertinent family history.  History  Substance Use Topics  . Smoking status: Current Everyday Smoker -- 1.0 packs/day    Types: Cigarettes  . Smokeless tobacco: Not on file  . Alcohol Use: Yes      Review of Systems  Constitutional: Negative for fever and chills.  Eyes: Positive for photophobia, pain, redness and visual disturbance. Negative for discharge.  Gastrointestinal: Negative for nausea and vomiting.  Neurological: Negative for weakness and headaches.    Allergies  Review of patient's allergies indicates no known allergies.  Home Medications   Current Outpatient Rx  Name Route Sig Dispense Refill  . HYDROCODONE-ACETAMINOPHEN 5-325 MG PO TABS  1-2 tablets po q 6 hours prn moderate to severe pain 15 tablet 0    BP 138/75   Pulse 86  Temp 98.1 F (36.7 C) (Oral)  Resp 18  SpO2 95%  Physical Exam  Nursing note and vitals reviewed. Constitutional: He is oriented to person, place, and time. He appears well-developed and well-nourished. No distress.  HENT:  Head: Normocephalic and atraumatic.  Eyes: EOM are normal. Pupils are equal, round, and reactive to light. Left eye exhibits chemosis. Left eye exhibits no hordeolum. Left conjunctiva is injected. Left conjunctiva has no hemorrhage. No scleral icterus.  Fundoscopic exam:      The left eye shows no exudate.  Slit lamp exam:      The left eye shows fluorescein uptake. The left eye shows no corneal abrasion, no corneal flare and no foreign body.       Diffuse conjunctival irratation, more on lateral portion of left eye. Minimal fluorescein uptake in that area. Negative Seidel sign.    Neck: Neck supple. No tracheal deviation present.  Cardiovascular: Normal rate.   Pulmonary/Chest: Effort normal. No respiratory distress.  Musculoskeletal: Normal range of motion.  Neurological: He is alert and oriented to person, place, and time.  Skin: Skin is warm and dry.  Psychiatric: He has a normal mood and affect. His behavior is normal.    ED Course  Procedures (including critical care time)  DIAGNOSTIC STUDIES: Oxygen Saturation is 95% on room air, adequate by my interpretation.    COORDINATION OF CARE:  11:42-Discussed planned course of treatment with the patient, who is agreeable at this time. Discussed f/u with opthalmologist in the  next couple of days.   11:45-Preformed eye exam with fluorescein and slit lamp.    Labs Reviewed - No data to display No results found.   1. Iritis       MDM   I personally performed the services described in this documentation, which was scribed in my presence. The recorded information has been reviewed and considered.   No FB or corneal abrasion seen, slight uptake to conjunctiva of left eye on lateral portion.   Will treat with analgesics and antibiotic drops.  Was going to give homatropine drops for pain here, but we are out.  Pt recommended to follow up with ophtho tomorrow.       Antonio Griffith. Ranvir Renovato, MD 10/17/11 1551

## 2011-10-15 NOTE — ED Notes (Signed)
Pt reports having pain and swelling to left eye that started this am, unsure of injury to eye.

## 2011-10-15 NOTE — Discharge Instructions (Signed)
 Use antibiotic eye drops every 4 hours for the next 5 days or instructed by the eye physician.  Be sure to follow up with one for close recheck in 1-2 days.  If vision worsens, develop severe headache, vomiting, please see your eye doctor or return for re-evaluation.  The homatropine drop today is to dilate your eye which will help with pain, but will keep your eye dilated for about 12 hours.

## 2011-10-15 NOTE — ED Notes (Signed)
Pharmacist called stated does not have homatropine available Doctor notified.

## 2011-10-17 ENCOUNTER — Encounter (HOSPITAL_COMMUNITY): Payer: Self-pay | Admitting: Emergency Medicine

## 2012-03-03 HISTORY — PX: COLONOSCOPY: SHX174

## 2012-09-12 ENCOUNTER — Encounter (HOSPITAL_COMMUNITY): Payer: Self-pay | Admitting: *Deleted

## 2012-09-12 ENCOUNTER — Emergency Department (HOSPITAL_COMMUNITY)
Admission: EM | Admit: 2012-09-12 | Discharge: 2012-09-12 | Disposition: A | Discharge status: Home / Self Care | Payer: Self-pay | Attending: Emergency Medicine | Admitting: Emergency Medicine

## 2012-09-12 DIAGNOSIS — K122 Cellulitis and abscess of mouth: Secondary | ICD-10-CM

## 2012-09-12 DIAGNOSIS — Z79899 Other long term (current) drug therapy: Secondary | ICD-10-CM | POA: Insufficient documentation

## 2012-09-12 DIAGNOSIS — H9201 Otalgia, right ear: Secondary | ICD-10-CM

## 2012-09-12 DIAGNOSIS — H9209 Otalgia, unspecified ear: Secondary | ICD-10-CM | POA: Insufficient documentation

## 2012-09-12 DIAGNOSIS — F17200 Nicotine dependence, unspecified, uncomplicated: Secondary | ICD-10-CM | POA: Insufficient documentation

## 2012-09-12 DIAGNOSIS — J3489 Other specified disorders of nose and nasal sinuses: Secondary | ICD-10-CM | POA: Insufficient documentation

## 2012-09-12 MED ORDER — PREDNISONE 10 MG PO TABS: ORAL_TABLET | ORAL | Status: DC

## 2012-09-12 MED ORDER — METOCLOPRAMIDE HCL 5 MG/ML IJ SOLN
10.0000 mg | Freq: Once | INTRAMUSCULAR | Status: AC
  Administered 2012-09-12: 10 mg via INTRAMUSCULAR
  Filled 2012-09-12: qty 2

## 2012-09-12 MED ORDER — CLINDAMYCIN HCL 150 MG PO CAPS
300.0000 mg | ORAL_CAPSULE | Freq: Once | ORAL | Status: AC
  Administered 2012-09-12: 300 mg via ORAL
  Filled 2012-09-12: qty 2

## 2012-09-12 MED ORDER — PREDNISONE 50 MG PO TABS
60.0000 mg | ORAL_TABLET | Freq: Once | ORAL | Status: AC
  Administered 2012-09-12: 60 mg via ORAL
  Filled 2012-09-12: qty 1

## 2012-09-12 MED ORDER — KETOROLAC TROMETHAMINE 60 MG/2ML IM SOLN
60.0000 mg | Freq: Once | INTRAMUSCULAR | Status: AC
  Administered 2012-09-12: 60 mg via INTRAMUSCULAR
  Filled 2012-09-12: qty 2

## 2012-09-12 MED ORDER — DIPHENHYDRAMINE HCL 25 MG PO CAPS
50.0000 mg | ORAL_CAPSULE | Freq: Once | ORAL | Status: AC
  Administered 2012-09-12: 50 mg via ORAL
  Filled 2012-09-12: qty 2

## 2012-09-12 MED ORDER — CLINDAMYCIN HCL 300 MG PO CAPS: 300.0000 mg | ORAL_CAPSULE | Freq: Four times a day (QID) | ORAL | Status: DC

## 2012-09-12 MED ORDER — ANTIPYRINE-BENZOCAINE 5.4-1.4 % OT SOLN
3.0000 [drp] | Freq: Once | OTIC | Status: AC
  Administered 2012-09-12: 4 [drp] via OTIC
  Filled 2012-09-12: qty 10

## 2012-09-12 NOTE — ED Notes (Signed)
Pt c/o sinus pressure, pain, headache, right ear ache that started last week of June but has gotten worse over past few days.

## 2012-09-12 NOTE — ED Provider Notes (Signed)
Medical screening examination/treatment/procedure(s) were conducted as a shared visit with non-physician practitioner(s) and myself.  I personally evaluated the patient during the encounter  Pt well appearing, handling secretions, no distress noted Agree with plan Stable for d/c  Joya Gaskins, MD 09/12/12 1542

## 2012-09-12 NOTE — ED Provider Notes (Signed)
History    CSN: [id number] Arrival date & time 09/12/12  7829  First MD Initiated Contact with Patient 09/12/12 [phone]     Chief Complaint  Patient presents with  . Facial Pain  . Headache   (Consider location/radiation/quality/duration/timing/severity/associated sxs/prior Treatment) Patient is a 27 y.o. male presenting with headaches. The history is provided by the patient.  Headache Pain location:  Frontal and R temporal Quality: Throbbing. Radiates to:  Does not radiate Severity currently:  3/10 Onset quality:  Gradual Duration:  3 days Timing:  Constant Progression:  Worsening Chronicity:  New Similar to prior headaches: yes   Context: not activity and not coughing   Relieved by:  Nothing Worsened by:  Nothing tried Ineffective treatments:  Acetaminophen Associated symptoms: congestion, ear pain, facial pain, sinus pressure and sore throat   Associated symptoms: no abdominal pain, no cough, no diarrhea, no dizziness, no pain, no fever, no focal weakness, no hearing loss, no loss of balance, no myalgias, no nausea, no near-syncope, no neck pain, no neck stiffness, no numbness, no paresthesias, no photophobia, no swollen glands, no syncope, no vomiting and no weakness   Congestion:    Location:  Nasal   Interferes with sleep: yes     Interferes with eating/drinking: no   Ear pain:    Location:  Right   Severity:  Moderate   Onset quality:  Gradual   Timing:  Constant   Progression:  Worsening   Chronicity:  New Sore throat:    Severity:  Moderate   Onset quality:  Sudden   Duration: Began last evening.   Timing:  Constant   Progression:  Worsening  History reviewed. No pertinent past medical history. Past Surgical History  Procedure Laterality Date  . Knee surgery     No family history on file. History  Substance Use Topics  . Smoking status: Current Every Day Smoker -- 1.00 packs/day    Types: Cigarettes  . Smokeless tobacco: Not on file  . Alcohol Use:  Yes    Review of Systems  Constitutional: Negative for fever, activity change and appetite change.  HENT: Positive for ear pain, congestion, sore throat and sinus pressure. Negative for hearing loss, facial swelling, rhinorrhea, trouble swallowing, neck pain, neck stiffness and dental problem.   Eyes: Negative for photophobia, pain and visual disturbance.  Respiratory: Negative for cough, chest tightness and shortness of breath.   Cardiovascular: Negative for chest pain, syncope and near-syncope.  Gastrointestinal: Negative for nausea, vomiting, abdominal pain and diarrhea.  Musculoskeletal: Negative for myalgias.  Skin: Negative for rash and wound.  Neurological: Positive for headaches. Negative for dizziness, focal weakness, syncope, facial asymmetry, speech difficulty, weakness, light-headedness, numbness, paresthesias and loss of balance.  Hematological: Negative for adenopathy.  Psychiatric/Behavioral: Negative for confusion and decreased concentration.  All other systems reviewed and are negative.    Allergies  Review of patient's allergies indicates no known allergies.  Home Medications   Current Outpatient Rx  Name  Route  Sig  Dispense  Refill  . oxymetazoline (AFRIN) 0.05 % nasal spray   Nasal   Place 2 sprays into the nose daily.         Marland Kitchen senna (SENOKOT) 8.6 MG TABS   Oral   Take 1 tablet by mouth daily.         . Sodium Chloride-Sodium Bicarb (AYR SALINE NASAL RINSE) 1.57 G PACK   Nasal   Place into the nose daily.  BP 146/86  Pulse 81  Temp(Src) 97.9 F (36.6 C) (Oral)  Resp 18  Ht 5\' 9"  (1.753 m)  Wt 320 lb (145.151 kg)  BMI 47.23 kg/m2  SpO2 98% Physical Exam  Nursing note and vitals reviewed. Constitutional: He is oriented to person, place, and time. He appears well-developed and well-nourished. No distress.  HENT:  Head: Normocephalic and atraumatic. No trismus in the jaw.  Right Ear: Ear canal normal. There is tenderness. No  drainage or swelling. No mastoid tenderness. Tympanic membrane is erythematous. Tympanic membrane is not perforated, not retracted and not bulging. A middle ear effusion is present. No hemotympanum. No decreased hearing is noted.  Left Ear: Tympanic membrane and ear canal normal.  Mouth/Throat: Uvula is midline and mucous membranes are normal. No oral lesions. Edematous present. No dental caries. Posterior oropharyngeal erythema present. No oropharyngeal exudate or posterior oropharyngeal edema.  Mild to moderate edema of the uvula. Airway is patent.  No PTA.    Eyes: EOM are normal. Pupils are equal, round, and reactive to light.  Neck: Normal range of motion and phonation normal. Neck supple. No rigidity. No Brudzinski's sign and no Kernig's sign noted.  Cardiovascular: Normal rate, regular rhythm, normal heart sounds and intact distal pulses.   No murmur heard. Pulmonary/Chest: Effort normal and breath sounds normal. No respiratory distress. He has no wheezes. He has no rales.  Abdominal: Soft. He exhibits no distension. There is no tenderness.  Musculoskeletal: Normal range of motion.  Neurological: He is alert and oriented to person, place, and time. No cranial nerve deficit or sensory deficit. He exhibits normal muscle tone. Coordination and gait normal.  Skin: Skin is warm and dry.    ED Course  Procedures (including critical care time) Labs Reviewed - No data to display   MDM    Patient with a history of frontal headache of gradual onset. Similar to previous headaches. No meningeal signs. Patient neurovascularly intact and nontoxic appearing. Mild to moderate edema of the uvula, but airway is patent at this time. Patient is handling his secretions well. Will treat with prednisone, Benadryl , clindamycin and auralgan otic drops.  He agrees to close f/u with his PMD.    Patient also seen by Dr. Bebe Shaggy prior to d/c and care plan discussed.  Patient appears stable for discharge and  agrees to return here if his sx's worsen  Lissie Hinesley L. Heaton Sarin, PA-C 09/12/12 1023

## 2012-10-10 ENCOUNTER — Emergency Department (HOSPITAL_COMMUNITY)
Admission: EM | Admit: 2012-10-10 | Discharge: 2012-10-10 | Disposition: A | Discharge status: Home / Self Care | Payer: Self-pay | Attending: Emergency Medicine | Admitting: Emergency Medicine

## 2012-10-10 ENCOUNTER — Encounter (HOSPITAL_COMMUNITY): Payer: Self-pay | Admitting: *Deleted

## 2012-10-10 DIAGNOSIS — R3919 Other difficulties with micturition: Secondary | ICD-10-CM | POA: Insufficient documentation

## 2012-10-10 DIAGNOSIS — M62830 Muscle spasm of back: Secondary | ICD-10-CM

## 2012-10-10 DIAGNOSIS — Y939 Activity, unspecified: Secondary | ICD-10-CM | POA: Insufficient documentation

## 2012-10-10 DIAGNOSIS — M545 Low back pain: Secondary | ICD-10-CM

## 2012-10-10 DIAGNOSIS — S39012A Strain of muscle, fascia and tendon of lower back, initial encounter: Secondary | ICD-10-CM

## 2012-10-10 DIAGNOSIS — S335XXA Sprain of ligaments of lumbar spine, initial encounter: Secondary | ICD-10-CM | POA: Insufficient documentation

## 2012-10-10 DIAGNOSIS — F17200 Nicotine dependence, unspecified, uncomplicated: Secondary | ICD-10-CM | POA: Insufficient documentation

## 2012-10-10 DIAGNOSIS — Z79899 Other long term (current) drug therapy: Secondary | ICD-10-CM | POA: Insufficient documentation

## 2012-10-10 DIAGNOSIS — X58XXXA Exposure to other specified factors, initial encounter: Secondary | ICD-10-CM | POA: Insufficient documentation

## 2012-10-10 DIAGNOSIS — M5380 Other specified dorsopathies, site unspecified: Secondary | ICD-10-CM | POA: Insufficient documentation

## 2012-10-10 DIAGNOSIS — Y929 Unspecified place or not applicable: Secondary | ICD-10-CM | POA: Insufficient documentation

## 2012-10-10 LAB — URINALYSIS, ROUTINE W REFLEX MICROSCOPIC
Glucose, UA: NEGATIVE mg/dL
Hgb urine dipstick: NEGATIVE
Ketones, ur: NEGATIVE mg/dL
Protein, ur: NEGATIVE mg/dL

## 2012-10-10 MED ORDER — DIAZEPAM 5 MG/ML IJ SOLN: 5.0000 mg | Freq: Once | INTRAMUSCULAR | Status: DC

## 2012-10-10 MED ORDER — IBUPROFEN 100 MG/5ML PO SUSP
800.0000 mg | Freq: Once | ORAL | Status: DC
  Filled 2012-10-10: qty 40

## 2012-10-10 MED ORDER — IBUPROFEN 800 MG PO TABS: 800.0000 mg | ORAL_TABLET | Freq: Three times a day (TID) | ORAL | Status: DC

## 2012-10-10 MED ORDER — DIAZEPAM 5 MG PO TABS
ORAL_TABLET | ORAL | Status: AC
  Administered 2012-10-10: 5 mg
  Filled 2012-10-10: qty 1

## 2012-10-10 MED ORDER — OXYCODONE-ACETAMINOPHEN 5-325 MG PO TABS: 1.0000 | ORAL_TABLET | Freq: Four times a day (QID) | ORAL | Status: DC | PRN

## 2012-10-10 MED ORDER — IBUPROFEN 800 MG PO TABS
ORAL_TABLET | ORAL | Status: AC
  Administered 2012-10-10: 800 mg
  Filled 2012-10-10: qty 1

## 2012-10-10 MED ORDER — OXYCODONE-ACETAMINOPHEN 5-325 MG PO TABS
2.0000 | ORAL_TABLET | Freq: Once | ORAL | Status: AC
  Administered 2012-10-10: 2 via ORAL
  Filled 2012-10-10: qty 2

## 2012-10-10 MED ORDER — DIAZEPAM 5 MG PO TABS: 5.0000 mg | ORAL_TABLET | Freq: Two times a day (BID) | ORAL | Status: DC

## 2012-10-10 NOTE — ED Provider Notes (Signed)
CSN: [id number]     Arrival date & time 10/10/12  0347 History     First MD Initiated Contact with Patient 10/10/12 0423     Chief Complaint  Patient presents with  . Back Pain  Antonio Griffith is a 27 y.o. male  HPI patient about severe right paraspinous lumbar back pain with back muscle spasm over the last couple of days, it is worsened, denies any n [place]in citing, no antecedent injury, the pain does not radiate, is not associated with any numbness, tingling, sagittal anesthesia, urinary or fecal incontinence, urinary retention, patient says his urine has been somewhat cloudy and urinating more frequently but denies any dysuria. No fevers or chills. He does not use IV drugs. Drinks socially  Past Medical History  Diagnosis Date  . Back pain    Past Surgical History  Procedure Laterality Date  . Knee surgery     History reviewed. No pertinent family history. History  Substance Use Topics  . Smoking status: Current Every Day Smoker -- 1.00 packs/day    Types: Cigarettes  . Smokeless tobacco: Not on file  . Alcohol Use: Yes    Review of Systems At least 10pt or greater review of systems completed and are negative except where specified in the HPI.  Allergies  Review of patient's allergies indicates no known allergies.  Home Medications   Current Outpatient Rx  Name  Route  Sig  Dispense  Refill  . clindamycin (CLEOCIN) 300 MG capsule   Oral   Take 1 capsule (300 mg total) by mouth 4 (four) times daily.   28 capsule   0   . oxymetazoline (AFRIN) 0.05 % nasal spray   Nasal   Place 2 sprays into the nose daily.         . predniSONE (DELTASONE) 10 MG tablet      Take 6 tablets day one, 5 tablets day two, 4 tablets day three, 3 tablets day four, 2 tablets day five, then 1 tablet day six   21 tablet   0   . senna (SENOKOT) 8.6 MG TABS   Oral   Take 1 tablet by mouth daily.         . Sodium Chloride-Sodium Bicarb (AYR SALINE NASAL RINSE) 1.57 G PACK   Nasal  Place into the nose daily.          BP 139/97  Pulse 77  Temp(Src) 97.5 F (36.4 C) (Oral)  Resp 20  Ht 5\' 9"  (1.753 m)  Wt 310 lb (140.615 kg)  BMI 45.76 kg/m2  SpO2 97% Physical Exam  Nursing notes reviewed.  Electronic medical record reviewed. VITAL SIGNS:   Filed Vitals:   10/10/12 0400  BP: 139/97  Pulse: 77  Temp: 97.5 F (36.4 C)  TempSrc: Oral  Resp: 20  Height: 5\' 9"  (1.753 m)  Weight: 310 lb (140.615 kg)  SpO2: 97%   CONSTITUTIONAL: Awake, oriented, appears uncomfortable HENT: Atraumatic, normocephalic, oral mucosa pink and moist, airway patent. Nares patent without drainage. External ears normal. EYES: Conjunctiva clear, EOMI, PERRLA NECK: Trachea midline, non-tender, supple CARDIOVASCULAR: Normal heart rate, Normal rhythm, No murmurs, rubs, gallops PULMONARY/CHEST: Clear to auscultation, no rhonchi, wheezes, or rales. Symmetrical breath sounds. Non-tender. ABDOMINAL: Non-distended, soft, non-tender - no rebound or guarding.  BS normal. NEUROLOGIC: Non-focal, moving all four extremities, no gross sensory or motor deficits. BACK: No step-offs or deformities, nontender to palpation in the midline, no skin abnormalities. Tenderness to palpation in the right sided lumbar  paraspinous muscles with palpable spasm EXTREMITIES: No clubbing, cyanosis, or edema SKIN: Warm, Dry, No erythema, No rash  ED Course   Procedures (including critical care time)  Labs Reviewed  URINALYSIS, ROUTINE W REFLEX MICROSCOPIC - Abnormal; Notable for the following:    Specific Gravity, Urine >1.030 (*)    All other components within normal limits   No results found. 1. Low back pain   2. Strain of lumbar paraspinous muscle, initial encounter   3. Spasm of lumbar paraspinous muscle    Medications  oxyCODONE-acetaminophen (PERCOCET/ROXICET) 5-325 MG per tablet 2 tablet (2 tablets Oral Given 10/10/12 0443)  diazepam (VALIUM) 5 MG tablet (5 mg  Given 10/10/12 0443)  ibuprofen  (ADVIL,MOTRIN) 800 MG tablet (800 mg  Given 10/10/12 0447)     MDM  Patient with lumbar paraspinous muscle spasm. Treated conservatively, urinalysis checked secondary to review of systems with cloudy urine frequency-no infection seen, likely amorphous urates, told the patient to increase his fluid intake. Wife says he does not typically drink enough fluids throughout the day. No red flags, do not think imaging is indicated the patient's back, no radiculopathy, do not suspect osteomyelitis, epidural abscess or another cause of cord compression or cauda equina syndrome. We'll treat patient pain have him followup with primary care, return for any worsening symptoms.  Jones Skene, MD 10/11/12 [phone]

## 2013-03-03 DIAGNOSIS — K635 Polyp of colon: Secondary | ICD-10-CM

## 2013-03-03 HISTORY — DX: Polyp of colon: K63.5

## 2013-06-03 ENCOUNTER — Emergency Department (HOSPITAL_COMMUNITY): Payer: BC Managed Care – PPO

## 2013-06-03 ENCOUNTER — Encounter (HOSPITAL_COMMUNITY): Payer: Self-pay | Admitting: Emergency Medicine

## 2013-06-03 ENCOUNTER — Emergency Department (HOSPITAL_COMMUNITY)
Admission: EM | Admit: 2013-06-03 | Discharge: 2013-06-03 | Disposition: A | Discharge status: Home / Self Care | Payer: BC Managed Care – PPO | Attending: Emergency Medicine | Admitting: Emergency Medicine

## 2013-06-03 DIAGNOSIS — N201 Calculus of ureter: Secondary | ICD-10-CM

## 2013-06-03 DIAGNOSIS — Z79899 Other long term (current) drug therapy: Secondary | ICD-10-CM | POA: Insufficient documentation

## 2013-06-03 DIAGNOSIS — K5900 Constipation, unspecified: Secondary | ICD-10-CM | POA: Insufficient documentation

## 2013-06-03 DIAGNOSIS — F17200 Nicotine dependence, unspecified, uncomplicated: Secondary | ICD-10-CM | POA: Insufficient documentation

## 2013-06-03 DIAGNOSIS — N23 Unspecified renal colic: Secondary | ICD-10-CM | POA: Insufficient documentation

## 2013-06-03 DIAGNOSIS — G8929 Other chronic pain: Secondary | ICD-10-CM | POA: Insufficient documentation

## 2013-06-03 HISTORY — DX: Dorsalgia, unspecified: M54.9

## 2013-06-03 HISTORY — DX: Other chronic pain: G89.29

## 2013-06-03 LAB — BASIC METABOLIC PANEL
BUN: 15 mg/dL (ref 6–23)
CALCIUM: 9.6 mg/dL (ref 8.4–10.5)
CO2: 24 meq/L (ref 19–32)
Chloride: 103 mEq/L (ref 96–112)
Creatinine, Ser: 1.24 mg/dL (ref 0.50–1.35)
GFR calc Af Amer: >90 mL/min (ref >90)
GFR calc non Af Amer: 78 mL/min — ABNORMAL LOW (ref >90)
Glucose, Bld: 101 mg/dL — ABNORMAL HIGH (ref 70–99)
Potassium: 4.1 mEq/L (ref 3.7–5.3)
Sodium: 141 mEq/L (ref 137–147)

## 2013-06-03 LAB — URINALYSIS, ROUTINE W REFLEX MICROSCOPIC
Bilirubin Urine: NEGATIVE
GLUCOSE, UA: NEGATIVE mg/dL
Ketones, ur: 15 mg/dL — AB
LEUKOCYTES UA: NEGATIVE
Nitrite: NEGATIVE
PH: 5.5 (ref 5.0–8.0)
Protein, ur: 30 mg/dL — AB
Specific Gravity, Urine: >1.03 — ABNORMAL HIGH (ref 1.005–1.030)
Urobilinogen, UA: 0.2 mg/dL (ref 0.0–1.0)

## 2013-06-03 LAB — URINE MICROSCOPIC-ADD ON

## 2013-06-03 LAB — CBC WITH DIFFERENTIAL/PLATELET
Basophils Absolute: 0 10*3/uL (ref 0.0–0.1)
Basophils Relative: 0 % (ref 0–1)
EOS PCT: 0 % (ref 0–5)
Eosinophils Absolute: 0 10*3/uL (ref 0.0–0.7)
HCT: 47.5 % (ref 39.0–52.0)
Hemoglobin: 16.6 g/dL (ref 13.0–17.0)
LYMPHS ABS: 2 10*3/uL (ref 0.7–4.0)
Lymphocytes Relative: 17 % (ref 12–46)
MCH: 29.9 pg (ref 26.0–34.0)
MCHC: 34.9 g/dL (ref 30.0–36.0)
MCV: 85.4 fL (ref 78.0–100.0)
Monocytes Absolute: 0.7 10*3/uL (ref 0.1–1.0)
Monocytes Relative: 6 % (ref 3–12)
NEUTROS PCT: 77 % (ref 43–77)
Neutro Abs: 9.2 10*3/uL — ABNORMAL HIGH (ref 1.7–7.7)
PLATELETS: 232 10*3/uL (ref 150–400)
RBC: 5.56 MIL/uL (ref 4.22–5.81)
RDW: 12.7 % (ref 11.5–15.5)
WBC: 12 10*3/uL — AB (ref 4.0–10.5)

## 2013-06-03 MED ORDER — SODIUM CHLORIDE 0.9 % IV SOLN
1000.0000 mL | INTRAVENOUS | Status: DC
  Administered 2013-06-03: 1000 mL via INTRAVENOUS

## 2013-06-03 MED ORDER — ONDANSETRON HCL 4 MG/2ML IJ SOLN
4.0000 mg | INTRAMUSCULAR | Status: DC | PRN
Start: 2013-06-03 — End: 2013-06-03

## 2013-06-03 MED ORDER — ONDANSETRON HCL 4 MG/2ML IJ SOLN
4.0000 mg | Freq: Once | INTRAMUSCULAR | Status: AC
  Administered 2013-06-03: 4 mg via INTRAVENOUS
  Filled 2013-06-03: qty 2

## 2013-06-03 MED ORDER — NAPROXEN 250 MG PO TABS: 250.0000 mg | ORAL_TABLET | Freq: Two times a day (BID) | ORAL | Status: DC

## 2013-06-03 MED ORDER — HYDROMORPHONE HCL PF 1 MG/ML IJ SOLN
1.0000 mg | Freq: Once | INTRAMUSCULAR | Status: AC
  Administered 2013-06-03: 1 mg via INTRAVENOUS
  Filled 2013-06-03: qty 1

## 2013-06-03 MED ORDER — ONDANSETRON HCL 4 MG PO TABS: 4.0000 mg | ORAL_TABLET | Freq: Three times a day (TID) | ORAL | Status: DC | PRN

## 2013-06-03 MED ORDER — TAMSULOSIN HCL 0.4 MG PO CAPS: 0.4000 mg | ORAL_CAPSULE | Freq: Every day | ORAL | Status: DC

## 2013-06-03 MED ORDER — FENTANYL CITRATE 0.05 MG/ML IJ SOLN
100.0000 ug | Freq: Once | INTRAMUSCULAR | Status: AC
  Administered 2013-06-03: 100 ug via INTRAVENOUS
  Filled 2013-06-03: qty 2

## 2013-06-03 MED ORDER — OXYCODONE-ACETAMINOPHEN 5-325 MG PO TABS: ORAL_TABLET | ORAL | Status: DC

## 2013-06-03 MED ORDER — KETOROLAC TROMETHAMINE 30 MG/ML IJ SOLN
30.0000 mg | Freq: Once | INTRAMUSCULAR | Status: AC
  Administered 2013-06-03: 30 mg via INTRAVENOUS
  Filled 2013-06-03: qty 1

## 2013-06-03 NOTE — Discharge Instructions (Signed)
°Emergency Department Resource Guide °1) Find a Doctor and Pay Out of Pocket °Although you won't have to find out who is covered by your insurance plan, it is a good idea to ask around and get recommendations. You will then need to call the office and see if the doctor you have chosen will accept you as a new patient and what types of options they offer for patients who are self-pay. Some doctors offer discounts or will set up payment plans for their patients who do not have insurance, but you will need to ask so you aren't surprised when you get to your appointment. ° °2) Contact Your Local Health Department °Not all health departments have doctors that can see patients for sick visits, but many do, so it is worth a call to see if yours does. If you don't know where your local health department is, you can check in your phone book. The CDC also has a tool to help you locate your state's health department, and many state websites also have listings of all of their local health departments. ° °3) Find a Walk-in Clinic °If your illness is not likely to be very severe or complicated, you may want to try a walk in clinic. These are popping up all over the country in pharmacies, drugstores, and shopping centers. They're usually staffed by nurse practitioners or physician assistants that have been trained to treat common illnesses and complaints. They're usually fairly quick and inexpensive. However, if you have serious medical issues or chronic medical problems, these are probably not your best option. ° °No Primary Care Doctor: °- Call [facility] Connect at  [phone] - they can help you locate a primary care doctor that  accepts your insurance, provides certain services, etc. °- Physician Referral Service- 1-800-533-3463 ° °Chronic Pain Problems: °Organization         Address  Phone   Notes  °[facility]  ([phone]) [phone] Patients need to be referred by their primary care doctor.  ° °Medication  Assistance: °Organization         Address  Phone   Notes  °[place] [facility] [address] °[place], [place] [address] (336) 641-8030 --Must be a resident of [place] °-- Must have NO insurance coverage whatsoever (no Medicaid/ Medicare, etc.) °-- The pt. MUST have a primary care doctor that directs their care regularly and follows them in the community °  °[facility]  (866) 331-1348   °[employer]  (888) 892-1162   ° °Agencies that provide inexpensive medical care: °Organization         Address  Phone   Notes  °[facility]  (336) 832-8035   °[facility]    (336) 832-7272   °[facility] [address] °[place], [place] [address] (336) 832-4777   °[facility] [address], °[place] (336) 271-4999   °[facility]    (336) 373-0678   °[facility]    (336) 272-1050   °[facility] ° [address], [place] Phone:  (336) 832-4444, Fax:  (336) 832-4440 Hours of Operation:  9 am - 6 pm, M-F.  Also accepts Medicaid/Medicare and self-pay.  °[facility] ° [address], [place] Phone: (336) [phone], Fax: (336) [phone]. Hours of Operation:  8:30 am - 5:30 pm, M-F.  Also accepts Medicaid and self-pay.  °[facility] [place] [address], [place] Phone: (336) 878-6027   °[facility] [address], [place], [place] (336)723-1848, Ext. 123 Mondays & Thursdays: 7-9 AM.  First 15 patients are seen on a first come, first serve basis. °  ° °Medicaid-accepting [place] Providers: ° °Organization         Address  Phone   Notes  °[facility] [address], [place] (336) 641-2100 Also accepts self-pay patients.  °[facility] [address], [place] ° (336) 856-9996   °[facility] [address], [place]  (336) 288-8857   °[facility] [address] [place] [address], [place] (336) 299-7000   °Veita Bland [address], [place]  ° (336) 373-1557 Only accepts [place] Access Medicaid patients after they have their name applied to their card.  ° °Self-Pay (no insurance) in [place]: ° °Organization         Address  Phone   Notes  °Sickle Cell Patients, [facility] [address], [place] (336) 832-1970   °[facility] [address], [place] (336) 832-4400   °[facility] ° [address], [place] (336) 992-4800   °[facility]/Dr. Osei-Bonsu ° [address] [place] [address], [place] or [address], [place] (336) [phone] Phone number for both [place] and [place] locations is the same.  °[facility] [address], [place] (336) 299-0000   °[facility] [address] [place] [address], [place] or [address] (336) 852-7530 °(336) 878-2260   °[facility] [address], [place] (336) 350-1642, phone; (336) 294-5005, fax Sees patients 1st and 3rd Saturday of every month.  Must not qualify for public or private insurance (i.e. Medicaid, Medicare, [place] Health Choice, Veterans' Benefits) • Household income should be no more than 200% of the poverty level •The clinic cannot treat you if you are pregnant or think you are pregnant • Sexually transmitted diseases are not treated at the clinic.  ° ° °Dental Care: °Organization         Address  Phone  Notes  °[place] [facility] [address],  (336) 641-6152 Accepts children up to age 21 who are enrolled in Medicaid or [place] Health Choice; pregnant women with a Medicaid card; and children who have applied for Medicaid or [place] Health Choice, but were declined, whose parents can pay a reduced fee at time of service.  °[place]  [facility] [place]  [address], [place] (336) 641-7733 Accepts children up to age 21 who are enrolled in Medicaid or [place] Health Choice; pregnant women with a Medicaid card; and children who have applied for Medicaid or  Health Choice, but were declined, whose parents can pay a reduced fee at time of service.  °[facility] ° [address],  (336) 641-4533 Patients are seen by appointment only. Walk-ins are not accepted. [facility] will see patients 18 years of age and older. °Monday - Tuesday (8am-5pm) °Most Wednesdays (8:30-5pm) °$30 per visit, cash only  °[facility] ° [address], [place] (336) 641-4533 Patients are seen by appointment only. Walk-ins are not accepted. [facility] will see patients 18 years of age and older. °One   Wednesday Evening (Monthly: Volunteer Based).  $30 per visit, cash only  °[facility]  (919) 537-3737 for adults; Children under age 4, call Graduate Pediatric Dentistry at ([phone]) [phone]. Children aged 4-14, please call (919) [phone] to request a pediatric application. ° Dental services are provided in all areas of dental care including fillings, crowns and bridges, complete and partial dentures, implants, gum treatment, root canals, and extractions. Preventive care is also provided. Treatment is provided to both adults and children. °Patients are selected via a lottery and there is often a waiting list. °  °[facility] [address], °[place] ° (336) 763-8833 [email or web address] °  °[facility] [address], [place], [place] (336)723-1848, Ext. 123 Second and Fourth Thursday of each month, opens at 6:30 AM; Clinic ends at 9 AM.  Patients are seen on a first-come first-served basis, and a limited number are seen during each clinic.  ° °[facility] ° [address], [place], [place] (336) 723-7904    Eligibility Requirements °You must have lived in [place], or [place] counties for at least the last three months. °  You cannot be eligible for state or federal sponsored healthcare insurance, including Veterans Administration, Medicaid, or Medicare. °  You generally cannot be eligible for healthcare insurance through your employer.  °  How to apply: °Eligibility screenings are held every Tuesday and Wednesday afternoon from 1:00 pm until 4:00 pm. You do not need an appointment for the interview!  °[facility] [address], [place], [place] 336-631-2330   °[facility]  336-342-8273   °[facility]  336-703-3100   °[facility]  336-570-6415   ° °Behavioral Health Resources in the Community: °Intensive Outpatient Programs °Organization         Address  Phone  Notes  °[facility] [address], [place], [place] 336-878-6098   °[facility] [address], [place], [place] 336-832-9800   °[facility] [address], [place], [place] ° 336-882-2125   °[facility] [address],  °[place], [place] 1-800-853-5163 or 336-641-4981   °Substance Abuse Resources °Organization         Address  Phone  Notes  °[facility]  336-882-2125   °[facility]  336-784-9470   °[facility]  336-285-9073   °[facility]  336-845-3988   °Residential & Outpatient Substance Abuse Program  1-800-659-3381   °Psychological Services °Organization         Address  Phone  Notes  °[facility]  336- 832-9600   °[facility]  336- 378-7881   °[facility] [address], [place] 1-800-853-5163 or 336-641-4981   ° °Mobile Crisis Teams °Organization         Address  Phone  Notes  °[facility], Mobile Crisis Care Unit  1-877-626-1772   °Assertive °[facility] ° [address]  [place], [place] 336-834-9664   °Sharon DeEsch [address] °[place] 336-554-5454   ° °Self-Help/Support Groups °Organization         Address  Phone             Notes  °[facility] [place] - variety of support groups  336- 373-1402 Call for more information  °Narcotics Anonymous (NA), [facility] [address], °[place]  2 meetings at this location  ° °  Residential Treatment Programs Organization         Address  Phone  Notes  [facility] [address]    [place]  1-(931)005-3170   [facility]  [address] [place] [id number], [place], [place]   [place], [place] [phone] Admissions: 8am-3pm M-F  [facility] [address]    [place], [place] 166-063-0160   [facility] [address] [place], [place], [place]   [facility] [address],  [place], [place]   [facility] Intensive Outpatient [address], [place], [place]   [facility] ([place]  [place], [place] 1-(417)596-8002 or (256)842-6950   [facility] [address], [place], [place] Accepts Medicaid  [facility] [address]  [place] 1-419 522 9332 Substance Abuse/Addiction Treatment   [facility]         Address  Phone  Notes  [facility]  (514)805-8690   Domenic Schwab, PhD [address] [place], [place]   509-142-0544 or 220-657-5642   [place], [place] (629)[phone]   [facility] [address] [place], [place] [phone] Insurance/Medicaid/sponsorship through [facility] [address] [place]                                    [place], [place] [phone] [place] [address], [place] [phone]    Dr. Adele Schilder  [phone]   [facility] Dept. 1) [address] [place] 2) [place] 3)  [address], [place] [phone]  [phone]   [place] [phone] or [phone] (After Hours)       Take the prescriptions as directed.  Call the Urologist on Monday to schedule a follow up appointment in the next 3 days.  Return to the Emergency Department immediately if worsening.

## 2013-06-03 NOTE — ED Notes (Signed)
Pt co rt sides flank pain that radiates to the rt groin area since last night.

## 2013-06-03 NOTE — ED Notes (Signed)
Pt restless in room, nausea, CT positive for kidney stone.

## 2013-06-03 NOTE — ED Provider Notes (Signed)
CSN: [id number]     Arrival date & time 06/03/13  1338 History   First MD Initiated Contact with Patient 06/03/13 1528     Chief Complaint  Patient presents with  . Flank Pain    rt      HPI Pt was seen at 1550. Per pt, c/o sudden onset and persistence of waxing and waning right sided flank "pain" that began last night. Pt describes the pain as "sharp," and radiating into the right side of his abd. Has been associated with nausea and "constipation." Denies testicular pain/swelling, no dysuria/hematuria, no abd pain, no diarrhea, no black or blood in stools, no CP/SOB.    Past Medical History  Diagnosis Date  . Chronic back pain    Past Surgical History  Procedure Laterality Date  . Knee surgery     History reviewed. No pertinent family history. FHx kidney stones  History  Substance Use Topics  . Smoking status: Current Every Day Smoker -- 1.00 packs/day    Types: Cigarettes  . Smokeless tobacco: Not on file  . Alcohol Use: Yes    Review of Systems ROS: Statement: All systems negative except as marked or noted in the HPI; Constitutional: Negative for fever and chills. ; ; Eyes: Negative for eye pain, redness and discharge. ; ; ENMT: Negative for ear pain, hoarseness, nasal congestion, sinus pressure and sore throat. ; ; Cardiovascular: Negative for chest pain, palpitations, diaphoresis, dyspnea and peripheral edema. ; ; Respiratory: Negative for cough, wheezing and stridor. ; ; Gastrointestinal: +nausea, constipation. Negative for vomiting, diarrhea, abdominal pain, blood in stool, hematemesis, jaundice and rectal bleeding. ; ; Genitourinary: +flank pain. Negative for dysuria and hematuria. ; ; Genital:  No penile drainage or rash, no testicular pain or swelling, no scrotal rash or swelling.;;  Musculoskeletal: Negative for back pain and neck pain. Negative for swelling and trauma.; ; Skin: Negative for pruritus, rash, abrasions, blisters, bruising and skin lesion.; ; Neuro: Negative  for headache, lightheadedness and neck stiffness. Negative for weakness, altered level of consciousness , altered mental status, extremity weakness, paresthesias, involuntary movement, seizure and syncope.      Allergies  Review of patient's allergies indicates no known allergies.  Home Medications   Current Outpatient Rx  Name  Route  Sig  Dispense  Refill  . polyethylene glycol powder (GLYCOLAX/MIRALAX) powder   Oral   Take 17 g by mouth daily.         Marland Kitchen senna (SENOKOT) 8.6 MG TABS   Oral   Take 1 tablet by mouth daily.          BP 151/84  Pulse 76  Temp(Src) 97.6 F (36.4 C) (Oral)  Resp 18  Ht 5\' 9"  (1.753 m)  Wt 320 lb (145.151 kg)  BMI 47.23 kg/m2  SpO2 100% Physical Exam 1555: Physical examination:  Nursing notes reviewed; Vital signs and O2 SAT reviewed;  Constitutional: Well developed, Well nourished, Well hydrated, Uncomfortable appearing.; Head:  Normocephalic, atraumatic; Eyes: EOMI, PERRL, No scleral icterus; ENMT: Mouth and pharynx normal, Mucous membranes moist; Neck: Supple, Full range of motion, No lymphadenopathy; Cardiovascular: Regular rate and rhythm, No murmur, rub, or gallop; Respiratory: Breath sounds clear & equal bilaterally, No rales, rhonchi, wheezes.  Speaking full sentences with ease, Normal respiratory effort/excursion; Chest: Nontender, Movement normal; Abdomen: Soft, Nontender, Nondistended, Normal bowel sounds; Genitourinary: No CVA tenderness; Spine:  No midline CS, TS, LS tenderness.;; Extremities: Pulses normal, No tenderness, No edema, No calf edema or asymmetry.; Neuro: AA&Ox3, Major  CN grossly intact.  Speech clear. No gross focal motor or sensory deficits in extremities. Climbs on and off stretcher easily by himself. Gait steady.; Skin: Color normal, Warm, Dry.   ED Course  Procedures    EKG Interpretation None      MDM  MDM Reviewed: previous chart, nursing note and vitals Reviewed previous: labs Interpretation: labs and CT  scan   Results for orders placed during the hospital encounter of 95/09/32  BASIC METABOLIC PANEL      Result Value Ref Range   Sodium 141  137 - 147 mEq/L   Potassium 4.1  3.7 - 5.3 mEq/L   Chloride 103  96 - 112 mEq/L   CO2 24  19 - 32 mEq/L   Glucose, Bld 101 (*) 70 - 99 mg/dL   BUN 15  6 - 23 mg/dL   Creatinine, Ser 1.24  0.50 - 1.35 mg/dL   Calcium 9.6  8.4 - 10.5 mg/dL   GFR calc non Af Amer 78 (*) >90 mL/min   GFR calc Af Amer >90  >90 mL/min  CBC WITH DIFFERENTIAL      Result Value Ref Range   WBC 12.0 (*) 4.0 - 10.5 K/uL   RBC 5.56  4.22 - 5.81 MIL/uL   Hemoglobin 16.6  13.0 - 17.0 g/dL   HCT 47.5  39.0 - 52.0 %   MCV 85.4  78.0 - 100.0 fL   MCH 29.9  26.0 - 34.0 pg   MCHC 34.9  30.0 - 36.0 g/dL   RDW 12.7  11.5 - 15.5 %   Platelets 232  150 - 400 K/uL   Neutrophils Relative % 77  43 - 77 %   Neutro Abs 9.2 (*) 1.7 - 7.7 K/uL   Lymphocytes Relative 17  12 - 46 %   Lymphs Abs 2.0  0.7 - 4.0 K/uL   Monocytes Relative 6  3 - 12 %   Monocytes Absolute 0.7  0.1 - 1.0 K/uL   Eosinophils Relative 0  0 - 5 %   Eosinophils Absolute 0.0  0.0 - 0.7 K/uL   Basophils Relative 0  0 - 1 %   Basophils Absolute 0.0  0.0 - 0.1 K/uL  URINALYSIS, ROUTINE W REFLEX MICROSCOPIC      Result Value Ref Range   Color, Urine YELLOW  YELLOW   APPearance CLEAR  CLEAR   Specific Gravity, Urine >1.030 (*) 1.005 - 1.030   pH 5.5  5.0 - 8.0   Glucose, UA NEGATIVE  NEGATIVE mg/dL   Hgb urine dipstick MODERATE (*) NEGATIVE   Bilirubin Urine NEGATIVE  NEGATIVE   Ketones, ur 15 (*) NEGATIVE mg/dL   Protein, ur 30 (*) NEGATIVE mg/dL   Urobilinogen, UA 0.2  0.0 - 1.0 mg/dL   Nitrite NEGATIVE  NEGATIVE   Leukocytes, UA NEGATIVE  NEGATIVE  URINE MICROSCOPIC-ADD ON      Result Value Ref Range   Squamous Epithelial / LPF RARE  RARE   WBC, UA 0-2  <3 WBC/hpf   RBC / HPF 7-10  <3 RBC/hpf   Bacteria, UA FEW (*) RARE   Crystals CA OXALATE CRYSTALS (*) NEGATIVE   Ct Abdomen Pelvis Wo  Contrast 06/03/2013   CLINICAL DATA:  Flank pain  EXAM: CT ABDOMEN AND PELVIS WITHOUT CONTRAST  TECHNIQUE: Multidetector CT imaging of the abdomen and pelvis was performed following the standard protocol without oral or intravenous contrast material administration.  COMPARISON:  June 01, 2013  FINDINGS: Lung bases  are clear.  The liver is prominent, measuring 17.8 cm in length. There is fatty change in the liver. No focal liver lesions are identified on this noncontrast enhanced study. There is no biliary duct dilatation. Gallbladder wall is not appreciably thickened.  Spleen, pancreas, and adrenals appear normal.  Left kidney appears normal without mass, calculus, or hydronephrosis. There is no ureteral calculus or ureterectasis on the left.  The right kidney is subtly edematous. There is moderate hydronephrosis on the right. There is no right renal mass. There is no intrarenal calculus on the right. There is a 3 mm calculus in the proximal right ureter at the level of L4, best seen on axial slice 59. There is surrounding ureteral edema in this area. There is no other ureteral calculus.  In the pelvis, the urinary bladder is midline with normal wall thickening. There is no pelvic mass or fluid collection. There is no periappendiceal region inflammation. Terminal ileum appears normal.  There is no bowel obstruction.  No free air or portal venous air.  There is no ascites, adenopathy, or abscess in the abdomen or pelvis. There is no evidence of abdominal aortic aneurysm.  There is a minimal ventral hernia containing only fat.  There is a calcified disc extrusion at L5-S1, moderately severe spinal stenosis focally at this level. There are no blastic or lytic bone lesions. There is lumbar levoscoliosis.  IMPRESSION: Calculus in the right ureter at the level of L4 causing moderate hydronephrosis and proximal ureterectasis on the right. There is localized edema at the site of the calculus on the right.  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  Liver is enlarged with fatty change.  No mesenteric inflammation or abscess.  No bowel obstruction.   Electronically Signed   By: Lowella Grip M.D.   On: 06/03/2013 17:27    1840:  Pt feels better after meds and wants to go home now. Dx and testing d/w pt and family.  Questions answered.  Verb understanding, agreeable to d/c home with outpt f/u.   Alfonzo Feller, DO 06/06/13 1237

## 2013-06-03 NOTE — ED Notes (Signed)
Pt also co constipation x 3 days.

## 2013-06-03 NOTE — ED Notes (Signed)
Patient given discharge instruction, verbalized understand. IV removed, band aid applied. Patient ambulatory out of the department.  

## 2013-08-18 ENCOUNTER — Emergency Department (HOSPITAL_COMMUNITY)
Admission: EM | Admit: 2013-08-18 | Discharge: 2013-08-19 | Disposition: A | Discharge status: Home / Self Care | Payer: BC Managed Care – PPO | Attending: Emergency Medicine | Admitting: Emergency Medicine

## 2013-08-18 ENCOUNTER — Encounter (HOSPITAL_COMMUNITY): Payer: Self-pay | Admitting: Emergency Medicine

## 2013-08-18 DIAGNOSIS — Z79899 Other long term (current) drug therapy: Secondary | ICD-10-CM | POA: Insufficient documentation

## 2013-08-18 DIAGNOSIS — G8929 Other chronic pain: Secondary | ICD-10-CM | POA: Insufficient documentation

## 2013-08-18 DIAGNOSIS — Z791 Long term (current) use of non-steroidal anti-inflammatories (NSAID): Secondary | ICD-10-CM | POA: Insufficient documentation

## 2013-08-18 DIAGNOSIS — H9209 Otalgia, unspecified ear: Secondary | ICD-10-CM | POA: Insufficient documentation

## 2013-08-18 DIAGNOSIS — F17200 Nicotine dependence, unspecified, uncomplicated: Secondary | ICD-10-CM | POA: Insufficient documentation

## 2013-08-18 DIAGNOSIS — H9202 Otalgia, left ear: Secondary | ICD-10-CM

## 2013-08-18 NOTE — ED Provider Notes (Signed)
CSN: [id number]     Arrival date & time 08/18/13  2313 History   First MD Initiated Contact with Patient 08/18/13 2356   This chart was scribed for Hoy Morn, MD by Anastasia Pall, ED Scribe. This patient was seen in room APA02/APA02 and the patient's care was started at 12:07 AM.    Chief Complaint  Patient presents with  . Otalgia    HPI HPI Comments: Antonio Griffith is a 28 y.o. male who presents to the Emergency Department complaining of intermittent left otalgia with associated hearing loss onset two weeks ago after he and his wife went swimming. Patient reports associated intermittent ear popping. He states that he has tried to clean out his ear with swimmer's ear solution, ear wax removal and ear candles with intermittent relief. Patient reports that the hearing loss improved by Monday and Tuesday, resuming back to normal, but reports that his symptoms returned the next day. He denies fever, neck swelling, dental pain, and any other associated symptoms.    Past Medical History  Diagnosis Date  . Chronic back pain    Past Surgical History  Procedure Laterality Date  . Knee surgery     History reviewed. No pertinent family history. History  Substance Use Topics  . Smoking status: Current Every Day Smoker -- 1.00 packs/day    Types: Cigarettes  . Smokeless tobacco: Not on file  . Alcohol Use: Yes    Review of Systems A complete 10 system review of systems was obtained and all systems are negative except as noted in the HPI and PMH.    Allergies  Review of patient's allergies indicates no known allergies.  Home Medications   Prior to Admission medications   Medication Sig Start Date End Date Taking? Authorizing Provider  senna (SENOKOT) 8.6 MG TABS Take 1 tablet by mouth daily.   Yes Historical Provider, MD  naproxen (NAPROSYN) 250 MG tablet Take 1 tablet (250 mg total) by mouth 2 (two) times daily with a meal. 06/03/13   Alfonzo Feller, DO  ondansetron (ZOFRAN) 4 MG  tablet Take 1 tablet (4 mg total) by mouth every 8 (eight) hours as needed for nausea or vomiting. 06/03/13   Alfonzo Feller, DO  oxyCODONE-acetaminophen (PERCOCET/ROXICET) 5-325 MG per tablet 1 or 2 tabs PO q6h prn pain 06/03/13   Alfonzo Feller, DO  polyethylene glycol powder (GLYCOLAX/MIRALAX) powder Take 17 g by mouth daily.    Historical Provider, MD  tamsulosin (FLOMAX) 0.4 MG CAPS capsule Take 1 capsule (0.4 mg total) by mouth at bedtime. 06/03/13   Alfonzo Feller, DO   BP 176/91  Pulse 89  Temp(Src) 98.3 F (36.8 C) (Oral)  Resp 20  Ht 5\' 9"  (1.753 m)  Wt 298 lb (135.172 kg)  BMI 43.99 kg/m2  SpO2 97% Physical Exam  Nursing note and vitals reviewed. Constitutional: He is oriented to person, place, and time. He appears well-developed and well-nourished.  HENT:  Head: Normocephalic.  Right TM partially occluded by cerumen, visible parts of TM are normal Mild scarring of left TM No bulging, no erythema, no swelling of left external auditory canal No significant mastoid tenderness  Eyes: EOM are normal.  Neck: Normal range of motion.  Pulmonary/Chest: Effort normal.  Abdominal: He exhibits no distension.  Musculoskeletal: Normal range of motion.  Neurological: He is alert and oriented to person, place, and time.  Psychiatric: He has a normal mood and affect.    ED Course  Procedures (including critical  care time) DIAGNOSTIC STUDIES: Oxygen Saturation is 97% on room air, normal by my interpretation.    COORDINATION OF CARE: 12:10 AM-Discussed treatment plan with pt at bedside and pt agreed to plan. Will give patient referral to ENT specialist for evaluation, Rx numbing drops to ease pain.    Medications  antipyrine-benzocaine (AURALGAN) otic solution 3-4 drop (4 drops Left Ear Given 08/19/13 0016)    Labs Review Labs Reviewed - No data to display  Imaging Review No results found.   EKG Interpretation None      MDM   Final diagnoses:  Left ear pain     Patient will be referred to ENT.  No mastoid tenderness.  No signs of otitis externa.  TM intact.  I personally performed the services described in this documentation, which was scribed in my presence. The recorded information has been reviewed and is accurate.       Hoy Morn, MD 08/19/13 [phone]

## 2013-08-18 NOTE — ED Notes (Signed)
Patient reports has had trouble hearing from left ear for approximately a week. States onset after swimming. Reports has attempted to use OTC swimmer's ear eardrops and ear wax removal kit. Reports no improvement and increasing pain.

## 2013-08-19 MED ORDER — ANTIPYRINE-BENZOCAINE 5.4-1.4 % OT SOLN
3.0000 [drp] | Freq: Once | OTIC | Status: AC
  Administered 2013-08-19: 4 [drp] via OTIC
  Filled 2013-08-19: qty 10

## 2013-09-22 ENCOUNTER — Encounter (HOSPITAL_COMMUNITY): Payer: Self-pay | Admitting: Emergency Medicine

## 2013-09-22 ENCOUNTER — Emergency Department (HOSPITAL_COMMUNITY)
Admission: EM | Admit: 2013-09-22 | Discharge: 2013-09-22 | Disposition: A | Discharge status: Home / Self Care | Payer: BC Managed Care – PPO | Attending: Emergency Medicine | Admitting: Emergency Medicine

## 2013-09-22 DIAGNOSIS — Z87891 Personal history of nicotine dependence: Secondary | ICD-10-CM | POA: Insufficient documentation

## 2013-09-22 DIAGNOSIS — Z791 Long term (current) use of non-steroidal anti-inflammatories (NSAID): Secondary | ICD-10-CM | POA: Insufficient documentation

## 2013-09-22 DIAGNOSIS — R1011 Right upper quadrant pain: Secondary | ICD-10-CM | POA: Insufficient documentation

## 2013-09-22 DIAGNOSIS — Z79899 Other long term (current) drug therapy: Secondary | ICD-10-CM | POA: Insufficient documentation

## 2013-09-22 DIAGNOSIS — E669 Obesity, unspecified: Secondary | ICD-10-CM | POA: Insufficient documentation

## 2013-09-22 DIAGNOSIS — G8929 Other chronic pain: Secondary | ICD-10-CM | POA: Insufficient documentation

## 2013-09-22 LAB — URINALYSIS, ROUTINE W REFLEX MICROSCOPIC
Bilirubin Urine: NEGATIVE
Glucose, UA: NEGATIVE mg/dL
Hgb urine dipstick: NEGATIVE
Ketones, ur: NEGATIVE mg/dL
Leukocytes, UA: NEGATIVE
Nitrite: NEGATIVE
Protein, ur: NEGATIVE mg/dL
Specific Gravity, Urine: 1.025 (ref 1.005–1.030)
Urobilinogen, UA: 0.2 mg/dL (ref 0.0–1.0)
pH: 6.5 (ref 5.0–8.0)

## 2013-09-22 LAB — COMPREHENSIVE METABOLIC PANEL
ALT: 37 U/L (ref 0–53)
AST: 19 U/L (ref 0–37)
Albumin: 3.6 g/dL (ref 3.5–5.2)
Alkaline Phosphatase: 57 U/L (ref 39–117)
Anion gap: 13 (ref 5–15)
BUN: 12 mg/dL (ref 6–23)
CO2: 25 mEq/L (ref 19–32)
Calcium: 8.8 mg/dL (ref 8.4–10.5)
Chloride: 104 mEq/L (ref 96–112)
Creatinine, Ser: 0.9 mg/dL (ref 0.50–1.35)
GFR calc Af Amer: >90 mL/min (ref >90)
GFR calc non Af Amer: >90 mL/min (ref >90)
Glucose, Bld: 114 mg/dL — ABNORMAL HIGH (ref 70–99)
Potassium: 3.8 mEq/L (ref 3.7–5.3)
Sodium: 142 mEq/L (ref 137–147)
Total Bilirubin: <0.2 mg/dL — ABNORMAL LOW (ref 0.3–1.2)
Total Protein: 6.8 g/dL (ref 6.0–8.3)

## 2013-09-22 LAB — LIPASE, BLOOD: Lipase: 25 U/L (ref 11–59)

## 2013-09-22 LAB — CBC WITH DIFFERENTIAL/PLATELET
Basophils Absolute: 0 10*3/uL (ref 0.0–0.1)
Basophils Relative: 0 % (ref 0–1)
Eosinophils Absolute: 0.2 10*3/uL (ref 0.0–0.7)
Eosinophils Relative: 2 % (ref 0–5)
HCT: 43.6 % (ref 39.0–52.0)
Hemoglobin: 15.3 g/dL (ref 13.0–17.0)
Lymphocytes Relative: 30 % (ref 12–46)
Lymphs Abs: 2.7 10*3/uL (ref 0.7–4.0)
MCH: 30.2 pg (ref 26.0–34.0)
MCHC: 35.1 g/dL (ref 30.0–36.0)
MCV: 86.2 fL (ref 78.0–100.0)
Monocytes Absolute: 0.4 10*3/uL (ref 0.1–1.0)
Monocytes Relative: 5 % (ref 3–12)
Neutro Abs: 5.6 10*3/uL (ref 1.7–7.7)
Neutrophils Relative %: 63 % (ref 43–77)
Platelets: 226 10*3/uL (ref 150–400)
RBC: 5.06 MIL/uL (ref 4.22–5.81)
RDW: 12.9 % (ref 11.5–15.5)
WBC: 8.9 10*3/uL (ref 4.0–10.5)

## 2013-09-22 MED ORDER — OXYCODONE-ACETAMINOPHEN 5-325 MG PO TABS: 1.0000 | ORAL_TABLET | ORAL | Status: DC | PRN

## 2013-09-22 MED ORDER — ONDANSETRON HCL 4 MG PO TABS: 4.0000 mg | ORAL_TABLET | Freq: Four times a day (QID) | ORAL | Status: DC

## 2013-09-22 MED ORDER — ONDANSETRON 4 MG PO TBDP
4.0000 mg | ORAL_TABLET | Freq: Once | ORAL | Status: AC
  Administered 2013-09-22: 4 mg via ORAL
  Filled 2013-09-22: qty 1

## 2013-09-22 MED ORDER — OXYCODONE-ACETAMINOPHEN 5-325 MG PO TABS
2.0000 | ORAL_TABLET | Freq: Once | ORAL | Status: AC
  Administered 2013-09-22: 2 via ORAL
  Filled 2013-09-22: qty 2

## 2013-09-22 NOTE — ED Notes (Signed)
Patient complaining of right lower chest tenderness starting this morning. States "I have been having problems like this for 2 months and they want to check my gallbladder and have a colonoscopy but they want cash up front and I don't have it."

## 2013-09-22 NOTE — Discharge Instructions (Signed)
Biliary Colic  °Biliary colic is a steady or irregular pain in the upper abdomen. It is usually under the right side of the rib cage. It happens when gallstones interfere with the normal flow of bile from the gallbladder. Bile is a liquid that helps to digest fats. Bile is made in the liver and stored in the gallbladder. When you eat a meal, bile passes from the gallbladder through the cystic duct and the common bile duct into the small intestine. There, it mixes with partially digested food. If a gallstone blocks either of these ducts, the normal flow of bile is blocked. The muscle cells in the bile duct contract forcefully to try to move the stone. This causes the pain of biliary colic.  °SYMPTOMS  °· A person with biliary colic usually complains of pain in the upper abdomen. This pain can be: °¨ In the center of the upper abdomen just below the breastbone. °¨ In the upper-right part of the abdomen, near the gallbladder and liver. °¨ Spread back toward the right shoulder blade. °· Nausea and vomiting. °· The pain usually occurs after eating. °· Biliary colic is usually triggered by the digestive system's demand for bile. The demand for bile is high after fatty meals. Symptoms can also occur when a person who has been fasting suddenly eats a very large meal. Most episodes of biliary colic pass after 1 to 5 hours. After the most intense pain passes, your abdomen may continue to ache mildly for about 24 hours. °DIAGNOSIS  °After you describe your symptoms, your caregiver will perform a physical exam. He or she will pay attention to the upper right portion of your belly (abdomen). This is the area of your liver and gallbladder. An ultrasound will help your caregiver look for gallstones. Specialized scans of the gallbladder may also be done. Blood tests may be done, especially if you have fever or if your pain persists. °PREVENTION  °Biliary colic can be prevented by controlling the risk factors for gallstones. Some of  these risk factors, such as heredity, increasing age, and pregnancy are a normal part of life. Obesity and a high-fat diet are risk factors you can change through a healthy lifestyle. Women going through menopause who take hormone replacement therapy (estrogen) are also more likely to develop biliary colic. °TREATMENT  °· Pain medication may be prescribed. °· You may be encouraged to eat a fat-free diet. °· If the first episode of biliary colic is severe, or episodes of colic keep retuning, surgery to remove the gallbladder (cholecystectomy) is usually recommended. This procedure can be done through small incisions using an instrument called a laparoscope. The procedure often requires a brief stay in the hospital. Some people can leave the hospital the same day. It is the most widely used treatment in people troubled by painful gallstones. It is effective and safe, with no complications in more than 90% of cases. °· If surgery cannot be done, medication that dissolves gallstones may be used. This medication is expensive and can take months or years to work. Only small stones will dissolve. °· Rarely, medication to dissolve gallstones is combined with a procedure called shock-wave lithotripsy. This procedure uses carefully aimed shock waves to break up gallstones. In many people treated with this procedure, gallstones form again within a few years. °PROGNOSIS  °If gallstones block your cystic duct or common bile duct, you are at risk for repeated episodes of biliary colic. There is also a 25% chance that you will develop   a gallbladder infection(acute cholecystitis), or some other complication of gallstones within 10 to 20 years. If you have surgery, schedule it at a time that is convenient for you and at a time when you are not sick. °HOME CARE INSTRUCTIONS  °· Drink plenty of clear fluids. °· Avoid fatty, greasy or fried foods, or any foods that make your pain worse. °· Take medications as directed. °SEEK MEDICAL  CARE IF:  °· You develop a fever over 100.5° F (38.1° C). °· Your pain gets worse over time. °· You develop nausea that prevents you from eating and drinking. °· You develop vomiting. °SEEK IMMEDIATE MEDICAL CARE IF:  °· You have continuous or severe belly (abdominal) pain which is not relieved with medications. °· You develop nausea and vomiting which is not relieved with medications. °· You have symptoms of biliary colic and you suddenly develop a fever and shaking chills. This may signal cholecystitis. Call your caregiver immediately. °· You develop a yellow color to your skin or the white part of your eyes (jaundice). °Document Released: 07/21/2005 Document Revised: 05/12/2011 Document Reviewed: 09/30/2007 °[employer]® Patient Information ©2015 [employer]. This information is not intended to replace advice given to you by your health care provider. Make sure you discuss any questions you have with your health care provider. ° °

## 2013-09-22 NOTE — ED Provider Notes (Signed)
CSN: [id number]     Arrival date & time 09/22/13  2111 History   First MD Initiated Contact with Patient 09/22/13 2117    This chart was scribed for Antonio Manifold, MD by Terressa Koyanagi, ED Scribe. This patient was seen in room APA10/APA10 and the patient's care was started at 9:39 PM.  Chief Complaint  Patient presents with  . Chest Pain   PCP: No PCP Per Patient  The history is provided by the patient. No language interpreter was used.   HPI Comments: Antonio Griffith is a 28 y.o. male, with medical Hx noted below, who presents to the Emergency Department complaining of an episode of RUQ pain. Pt rates his current pain a 4 out of 10. Pt reports that he has been experiencing similar episodes intermittently over the past 2 months. Pt describes the pain as switching off between being sharp and being dull.  Sometimes will goes days without pain. Feels like pain episodes are becoming more frequent and longer in duration though. Often worse right after eating. Pt denies vomiting, urinary Sx. no fevers or chills. No history of any prior abdominal surgery. No diarrhea. Rarely drinks alcohol.  Past Medical History  Diagnosis Date  . Chronic back pain    Past Surgical History  Procedure Laterality Date  . Knee surgery     History reviewed. No pertinent family history. History  Substance Use Topics  . Smoking status: Former Smoker -- 1.00 packs/day    Types: Cigarettes  . Smokeless tobacco: Not on file  . Alcohol Use: Yes     Comment: one a month    Review of Systems  Cardiovascular: Positive for chest pain (right lower chest tenderness).  All other systems reviewed and are negative.  Allergies  Review of patient's allergies indicates no known allergies.  Home Medications   Prior to Admission medications   Medication Sig Start Date End Date Taking? Authorizing Provider  naproxen (NAPROSYN) 250 MG tablet Take 1 tablet (250 mg total) by mouth 2 (two) times daily with a meal. 06/03/13    Alfonzo Feller, DO  ondansetron (ZOFRAN) 4 MG tablet Take 1 tablet (4 mg total) by mouth every 8 (eight) hours as needed for nausea or vomiting. 06/03/13   Alfonzo Feller, DO  oxyCODONE-acetaminophen (PERCOCET/ROXICET) 5-325 MG per tablet 1 or 2 tabs PO q6h prn pain 06/03/13   Alfonzo Feller, DO  polyethylene glycol powder (GLYCOLAX/MIRALAX) powder Take 17 g by mouth daily.    Historical Provider, MD  senna (SENOKOT) 8.6 MG TABS Take 1 tablet by mouth daily.    Historical Provider, MD  tamsulosin (FLOMAX) 0.4 MG CAPS capsule Take 1 capsule (0.4 mg total) by mouth at bedtime. 06/03/13   Alfonzo Feller, DO   Triage Vitals: BP 157/85  Pulse 74  Temp(Src) 98 F (36.7 C) (Oral)  Resp 14  Ht 5\' 9"  (1.753 m)  Wt 304 lb (137.893 kg)  BMI 44.87 kg/m2  SpO2 97% Physical Exam  Nursing note and vitals reviewed. Constitutional: He is oriented to person, place, and time. He appears well-developed and well-nourished.  Laying in bed. No acute distress. Obese.  HENT:  Head: Normocephalic and atraumatic.  Eyes: EOM are normal.  Neck: Normal range of motion.  Cardiovascular: Normal rate, regular rhythm, normal heart sounds and intact distal pulses.   Pulmonary/Chest: Effort normal and breath sounds normal. No respiratory distress.  Abdominal: Soft. He exhibits no distension. There is tenderness.  Mild tenderness in right upper  quadrant. No rebound or guarding. No distention.  Musculoskeletal: Normal range of motion.  Neurological: He is alert and oriented to person, place, and time.  Skin: Skin is warm and dry.  Psychiatric: He has a normal mood and affect. Judgment normal.    ED Course  Procedures (including critical care time) DIAGNOSTIC STUDIES: Oxygen Saturation is 97% on RA, nl by my interpretation.    COORDINATION OF CARE: 9:48 PM-Discussed treatment plan which includes ultrasound, labs, and meds with pt at bedside and pt agreed to plan.   Labs Review Labs Reviewed   COMPREHENSIVE METABOLIC PANEL - Abnormal; Notable for the following:    Glucose, Bld 114 (*)    Total Bilirubin <0.2 (*)    All other components within normal limits  LIPASE, BLOOD  URINALYSIS, ROUTINE W REFLEX MICROSCOPIC  CBC WITH DIFFERENTIAL    Imaging Review No results found.   EKG Interpretation None      MDM   Final diagnoses:  RUQ pain    28 year old male with right upper quadrant pain. Patient gives a pretty good story for biliary colic. Epigastric/right upper quadrant. Intermittent. Often worse shortly after eating. Sometime radiation into his back. Attempted bedside ultrasound but was unable to get adequate views. Patient's body habitus is somewhat prohibitive. Labs unremarkable. LFTs normal. Lipase normal. He is afebrile. Appears well. Minimal tenderness on exam. Unfortunately, cannot obtain a formal ultrasound at this time a day. Will have patient return for ultrasound tomorrow. When necessary pain medication and anti-emetic. Return cautions were discussed.   I personally preformed the services scribed in my presence. The recorded information has been reviewed is accurate. Antonio Manifold, MD.    Antonio Manifold, MD 09/22/13 [phone]

## 2013-09-22 NOTE — ED Notes (Signed)
Patient states upper abdominal RUQ pain X2 months. Patient states that he has been seen by GI and was told he has "Gallbladder problems" patient states pain is worse when he eats to RUQ and describes the pain as"eating grenades" patient is A&OX4

## 2013-09-22 NOTE — ED Notes (Signed)
Patient verbalizes understanding of discharge instructions, prescription medications, follow up care and out patient appointment. Patient ambulatory out of department at this time.

## 2013-09-23 ENCOUNTER — Ambulatory Visit (HOSPITAL_COMMUNITY)
Admit: 2013-09-23 | Discharge: 2013-09-23 | Disposition: A | Discharge status: Home / Self Care | Payer: BC Managed Care – PPO | Attending: Emergency Medicine | Admitting: Emergency Medicine

## 2013-09-23 ENCOUNTER — Other Ambulatory Visit (HOSPITAL_COMMUNITY): Payer: Self-pay | Admitting: Emergency Medicine

## 2013-09-23 DIAGNOSIS — R1011 Right upper quadrant pain: Secondary | ICD-10-CM | POA: Insufficient documentation

## 2013-09-23 DIAGNOSIS — K7689 Other specified diseases of liver: Secondary | ICD-10-CM | POA: Insufficient documentation

## 2013-09-23 DIAGNOSIS — R112 Nausea with vomiting, unspecified: Secondary | ICD-10-CM | POA: Insufficient documentation

## 2013-09-23 NOTE — ED Provider Notes (Signed)
US Abdomen Limited Ruq  09/23/2013   CLINICAL DATA:  28 year old male with right upper quadrant pain nausea and vomiting. Initial encounter.  EXAM: US ABDOMEN LIMITED - RIGHT UPPER QUADRANT  COMPARISON:  CT Abdomen and Pelvis 06/03/2013.  FINDINGS: Gallbladder:  No gallstones or wall thickening visualized. No sonographic Murphy sign noted.  Common bile duct:  Diameter: 5 mm, normal  Liver:  Increased echogenicity (image 10). No intrahepatic biliary ductal dilatation. No discrete liver lesion.  Other findings:    Negative visible right kidney.  IMPRESSION: Hepatic steatosis.  Normal gallbladder.   Electronically Signed   By: Lars Pinks M.D.   On: 09/23/2013 14:24    Notified patient of the results.  No acute findings.  Follow up with a PCP  Dorie Rank, MD 09/23/13 [phone]

## 2013-11-01 HISTORY — PX: COLONOSCOPY: SHX5424

## 2013-11-01 HISTORY — PX: ESOPHAGOGASTRODUODENOSCOPY: SHX1529

## 2013-12-26 HISTORY — PX: CHOLECYSTECTOMY: SHX55

## 2014-01-30 ENCOUNTER — Emergency Department (HOSPITAL_COMMUNITY): Payer: BC Managed Care – PPO

## 2014-01-30 ENCOUNTER — Emergency Department (HOSPITAL_COMMUNITY)
Admission: EM | Admit: 2014-01-30 | Discharge: 2014-01-30 | Disposition: A | Discharge status: Home / Self Care | Payer: BC Managed Care – PPO | Attending: Emergency Medicine | Admitting: Emergency Medicine

## 2014-01-30 ENCOUNTER — Encounter (HOSPITAL_COMMUNITY): Payer: Self-pay | Admitting: *Deleted

## 2014-01-30 DIAGNOSIS — Z79899 Other long term (current) drug therapy: Secondary | ICD-10-CM | POA: Insufficient documentation

## 2014-01-30 DIAGNOSIS — R1084 Generalized abdominal pain: Secondary | ICD-10-CM

## 2014-01-30 DIAGNOSIS — Z87891 Personal history of nicotine dependence: Secondary | ICD-10-CM | POA: Insufficient documentation

## 2014-01-30 DIAGNOSIS — Z86010 Personal history of colonic polyps: Secondary | ICD-10-CM | POA: Diagnosis not present

## 2014-01-30 DIAGNOSIS — R197 Diarrhea, unspecified: Secondary | ICD-10-CM | POA: Insufficient documentation

## 2014-01-30 DIAGNOSIS — Z9089 Acquired absence of other organs: Secondary | ICD-10-CM | POA: Insufficient documentation

## 2014-01-30 DIAGNOSIS — G8929 Other chronic pain: Secondary | ICD-10-CM | POA: Diagnosis not present

## 2014-01-30 DIAGNOSIS — R109 Unspecified abdominal pain: Secondary | ICD-10-CM | POA: Diagnosis present

## 2014-01-30 HISTORY — DX: Polyp of colon: K63.5

## 2014-01-30 LAB — CBC WITH DIFFERENTIAL/PLATELET
BASOS ABS: 0 10*3/uL (ref 0.0–0.1)
Basophils Relative: 0 % (ref 0–1)
Eosinophils Absolute: 0.1 10*3/uL (ref 0.0–0.7)
Eosinophils Relative: 1 % (ref 0–5)
HCT: 48.6 % (ref 39.0–52.0)
Hemoglobin: 16.8 g/dL (ref 13.0–17.0)
LYMPHS ABS: 2.9 10*3/uL (ref 0.7–4.0)
LYMPHS PCT: 26 % (ref 12–46)
MCH: 30.4 pg (ref 26.0–34.0)
MCHC: 34.6 g/dL (ref 30.0–36.0)
MCV: 88 fL (ref 78.0–100.0)
Monocytes Absolute: 0.7 10*3/uL (ref 0.1–1.0)
Monocytes Relative: 7 % (ref 3–12)
NEUTROS ABS: 7.2 10*3/uL (ref 1.7–7.7)
NEUTROS PCT: 66 % (ref 43–77)
PLATELETS: 226 10*3/uL (ref 150–400)
RBC: 5.52 MIL/uL (ref 4.22–5.81)
RDW: 12.6 % (ref 11.5–15.5)
WBC: 11 10*3/uL — AB (ref 4.0–10.5)

## 2014-01-30 LAB — COMPREHENSIVE METABOLIC PANEL
ALK PHOS: 67 U/L (ref 39–117)
ALT: 54 U/L — AB (ref 0–53)
AST: 24 U/L (ref 0–37)
Albumin: 4 g/dL (ref 3.5–5.2)
Anion gap: 12 (ref 5–15)
BUN: 13 mg/dL (ref 6–23)
CHLORIDE: 100 meq/L (ref 96–112)
CO2: 29 meq/L (ref 19–32)
Calcium: 9.4 mg/dL (ref 8.4–10.5)
Creatinine, Ser: 0.92 mg/dL (ref 0.50–1.35)
GLUCOSE: 86 mg/dL (ref 70–99)
POTASSIUM: 4.2 meq/L (ref 3.7–5.3)
Sodium: 141 mEq/L (ref 137–147)
Total Bilirubin: 0.2 mg/dL — ABNORMAL LOW (ref 0.3–1.2)
Total Protein: 7.7 g/dL (ref 6.0–8.3)

## 2014-01-30 LAB — URINALYSIS, ROUTINE W REFLEX MICROSCOPIC
BILIRUBIN URINE: NEGATIVE
Glucose, UA: NEGATIVE mg/dL
Hgb urine dipstick: NEGATIVE
KETONES UR: NEGATIVE mg/dL
Leukocytes, UA: NEGATIVE
Nitrite: NEGATIVE
PH: 7 (ref 5.0–8.0)
Protein, ur: NEGATIVE mg/dL
Specific Gravity, Urine: 1.01 (ref 1.005–1.030)
Urobilinogen, UA: 0.2 mg/dL (ref 0.0–1.0)

## 2014-01-30 LAB — LIPASE, BLOOD: Lipase: 25 U/L (ref 11–59)

## 2014-01-30 MED ORDER — LORAZEPAM 2 MG/ML IJ SOLN
1.0000 mg | Freq: Once | INTRAMUSCULAR | Status: AC
  Administered 2014-01-30: 1 mg via INTRAVENOUS
  Filled 2014-01-30: qty 1

## 2014-01-30 MED ORDER — HYDROCODONE-ACETAMINOPHEN 5-325 MG PO TABS
1.0000 | ORAL_TABLET | Freq: Once | ORAL | Status: AC
  Administered 2014-01-30: 1 via ORAL
  Filled 2014-01-30: qty 1

## 2014-01-30 MED ORDER — LANSOPRAZOLE 30 MG PO CPDR: 30.0000 mg | DELAYED_RELEASE_CAPSULE | Freq: Every day | ORAL | Status: DC

## 2014-01-30 MED ORDER — IOHEXOL 300 MG/ML  SOLN
50.0000 mL | Freq: Once | INTRAMUSCULAR | Status: AC | PRN
  Administered 2014-01-30: 50 mL via ORAL

## 2014-01-30 MED ORDER — OXYCODONE-ACETAMINOPHEN 5-325 MG PO TABS: 1.0000 | ORAL_TABLET | Freq: Four times a day (QID) | ORAL | Status: DC | PRN

## 2014-01-30 MED ORDER — ONDANSETRON HCL 4 MG/2ML IJ SOLN
4.0000 mg | Freq: Once | INTRAMUSCULAR | Status: AC
  Administered 2014-01-30: 4 mg via INTRAVENOUS
  Filled 2014-01-30: qty 2

## 2014-01-30 MED ORDER — IOHEXOL 300 MG/ML  SOLN
100.0000 mL | Freq: Once | INTRAMUSCULAR | Status: AC | PRN
  Administered 2014-01-30: 100 mL via INTRAVENOUS

## 2014-01-30 MED ORDER — HYDROMORPHONE HCL 1 MG/ML IJ SOLN
1.0000 mg | Freq: Once | INTRAMUSCULAR | Status: AC
  Administered 2014-01-30: 1 mg via INTRAVENOUS
  Filled 2014-01-30: qty 1

## 2014-01-30 MED ORDER — SUCRALFATE 1 GM/10ML PO SUSP: 1.0000 g | Freq: Three times a day (TID) | ORAL | Status: DC

## 2014-01-30 NOTE — ED Notes (Signed)
Pt verbalized understanding of no driving and to use caution within 4 hours of taking pain meds due to meds cause drowsiness 

## 2014-01-30 NOTE — ED Notes (Signed)
MD at bedside. 

## 2014-01-30 NOTE — Discharge Instructions (Signed)
As discussed, your evaluation today has been largely reassuring.  But, it is important that you monitor your condition carefully, and do not hesitate to return to the ED if you develop new, or concerning changes in your condition.  Otherwise, please follow-up with your physician for appropriate ongoing care.  If you are not contacted by your physicians office tomorrow, please use the provided information to ensure appropriate ongoing care.   Abdominal Pain Many things can cause abdominal pain. Usually, abdominal pain is not caused by a disease and will improve without treatment. It can often be observed and treated at home. Your health care provider will do a physical exam and possibly order blood tests and X-rays to help determine the seriousness of your pain. However, in many cases, more time must pass before a clear cause of the pain can be found. Before that point, your health care provider may not know if you need more testing or further treatment. HOME CARE INSTRUCTIONS  Monitor your abdominal pain for any changes. The following actions may help to alleviate any discomfort you are experiencing:  Only take over-the-counter or prescription medicines as directed by your health care provider.  Do not take laxatives unless directed to do so by your health care provider.  Try a clear liquid diet (broth, tea, or water) as directed by your health care provider. Slowly move to a bland diet as tolerated. SEEK MEDICAL CARE IF:  You have unexplained abdominal pain.  You have abdominal pain associated with nausea or diarrhea.  You have pain when you urinate or have a bowel movement.  You experience abdominal pain that wakes you in the night.  You have abdominal pain that is worsened or improved by eating food.  You have abdominal pain that is worsened with eating fatty foods.  You have a fever. SEEK IMMEDIATE MEDICAL CARE IF:   Your pain does not go away within 2 hours.  You keep throwing  up (vomiting).  Your pain is felt only in portions of the abdomen, such as the right side or the left lower portion of the abdomen.  You pass bloody or black tarry stools. MAKE SURE YOU:  Understand these instructions.   Will watch your condition.   Will get help right away if you are not doing well or get worse.  Document Released: 11/27/2004 Document Revised: 02/22/2013 Document Reviewed: 10/27/2012 [facility] Patient Information 2015 [place], [place]. This information is not intended to replace advice given to you by your health care provider. Make sure you discuss any questions you have with your health care provider.

## 2014-01-30 NOTE — ED Provider Notes (Signed)
CSN: [id number]     Arrival date & time 01/30/14  1558 History  This chart was scribe for Carmin Muskrat, MD by Judithann Sauger, ED Scribe. The patient was seen in room APA04/APA04 and the patient's care was started at 5:27 PM.    Chief Complaint  Patient presents with  . Abdominal Pain   The history is provided by the patient. No language interpreter was used.   HPI Comments: Antonio Griffith is a 28 y.o. male who presents to the Emergency Department complaining of a gradually worsening epigastric pain onset 2 weeks ago. He reports similar pain to when he has a cholecystectomy. He reports associated three vomiting episodes and two bloody stools after lunch at 1 pm. He reports that he had been feeling really "gittery" all day. He reports that when the pain "hit" him last night, he felt like he was SOB and about to pass out but denies syncope. He also reports feeling like passing out after his last BM today. He denies smoking or alcohol use. He reports a family hx of diverticulosis.   Gastroenterologist: Dr. Britta Mccreedy, [facility] No PCP.   Past Medical History  Diagnosis Date  . Chronic back pain   . Colon polyps 2015   Past Surgical History  Procedure Laterality Date  . Knee surgery    . Cholecystectomy     No family history on file. History  Substance Use Topics  . Smoking status: Former Smoker -- 1.00 packs/day    Types: Cigarettes  . Smokeless tobacco: Not on file  . Alcohol Use: No     Comment: one a month    Review of Systems  Constitutional: Negative for fever.       Per HPI, otherwise negative  HENT:       Per HPI, otherwise negative  Respiratory:       Per HPI, otherwise negative  Cardiovascular:       Per HPI, otherwise negative  Gastrointestinal: Positive for vomiting, abdominal pain, diarrhea and blood in stool.  Endocrine:       Negative aside from HPI  Genitourinary:       Neg aside from HPI   Musculoskeletal:       Per HPI, otherwise negative   Skin: Negative.   Neurological: Negative for syncope.      Allergies  Review of patient's allergies indicates no known allergies.  Home Medications   Prior to Admission medications   Medication Sig Start Date End Date Taking? Authorizing Provider  magnesium hydroxide (MILK OF MAGNESIA) 400 MG/5ML suspension Take 30 mLs by mouth at bedtime.   Yes Historical Provider, MD  Probiotic Product (PROBIOTIC DAILY PO) Take 1 capsule by mouth daily.   Yes Historical Provider, MD  senna (SENOKOT) 8.6 MG TABS Take 1 tablet by mouth daily.   Yes Historical Provider, MD  Simethicone (GAS-X MAXIMUM STRENGTH PO) Take 1 tablet by mouth daily as needed. Gas discomfort   Yes Historical Provider, MD  lansoprazole (PREVACID) 30 MG capsule Take 1 capsule (30 mg total) by mouth daily. 01/30/14   Carmin Muskrat, MD  oxyCODONE-acetaminophen (PERCOCET/ROXICET) 5-325 MG per tablet Take 1 tablet by mouth every 6 (six) hours as needed for severe pain. 01/30/14   Carmin Muskrat, MD  sucralfate (CARAFATE) 1 GM/10ML suspension Take 10 mLs (1 g total) by mouth 4 (four) times daily -  with meals and at bedtime. 01/30/14   Carmin Muskrat, MD   BP 135/75 mmHg  Pulse 71  Temp(Src) 98  F (36.7 C) (Oral)  Resp 20  Ht 5\' 9"  (1.753 m)  Wt 304 lb (137.893 kg)  BMI 44.87 kg/m2  SpO2 100% Physical Exam  Constitutional: He is oriented to person, place, and time. He appears well-developed. No distress.  HENT:  Head: Normocephalic and atraumatic.  Eyes: Conjunctivae and EOM are normal.  Cardiovascular: Normal rate and regular rhythm.   Pulmonary/Chest: Effort normal. No stridor. No respiratory distress.  Abdominal: He exhibits no distension. There is tenderness.  Epigastric tenderness  Musculoskeletal: He exhibits no edema.  Neurological: He is alert and oriented to person, place, and time.  Skin: Skin is warm and dry.  Psychiatric: He has a normal mood and affect.  Nursing note and vitals reviewed.   ED Course   Procedures (including critical care time) DIAGNOSTIC STUDIES: Oxygen Saturation is 100% on RA, normal by my interpretation.    COORDINATION OF CARE: 5:35 PM- Pt advised of plan for treatment and pt agrees.    Labs Review Labs Reviewed  CBC WITH DIFFERENTIAL - Abnormal; Notable for the following:    WBC 11.0 (*)    All other components within normal limits  COMPREHENSIVE METABOLIC PANEL - Abnormal; Notable for the following:    ALT 54 (*)    Total Bilirubin 0.2 (*)    All other components within normal limits  LIPASE, BLOOD  URINALYSIS, ROUTINE W REFLEX MICROSCOPIC    Imaging Review Ct Abdomen Pelvis W Contrast  01/30/2014   CLINICAL DATA:  Increased abdominal pain for 2 weeks. History of gallbladder removal approximately 1 month ago.  EXAM: CT ABDOMEN AND PELVIS WITH CONTRAST  TECHNIQUE: Multidetector CT imaging of the abdomen and pelvis was performed using the standard protocol following bolus administration of intravenous contrast.  CONTRAST:  41mL OMNIPAQUE IOHEXOL 300 MG/ML SOLN, 157mL OMNIPAQUE IOHEXOL 300 MG/ML SOLN  COMPARISON:  CT abdomen pelvis - 10/17/2013  FINDINGS: Normal hepatic contour. There is mild diffuse decreased attenuation of the hepatic parenchyma on this postcontrast examination suggestive of hepatic steatosis. Post cholecystectomy. No intra or extrahepatic biliary duct dilatation. No ascites.  There is symmetric enhancement and excretion of the bilateral kidneys. No definite renal stones this postcontrast examination. No discrete renal lesions. No urinary obstruction or perinephric stranding. Normal appearance of the bilateral adrenal glands and spleen. Note is made of a small splenule. There is age advanced fatty atrophy of the pancreas. No peripancreatic stranding.  Ingested enteric contrast extends to the level of the distal small bowel. The bowel is normal in course and caliber without wall thickening or evidence of obstruction. Normal appearance of the terminal  ileum. The appendix is not visualized however there is no inflammatory change within the right lower abdominal quadrant. No pneumoperitoneum, pneumatosis or portal venous gas.  Normal caliber the abdominal aorta. The major branch vessels of the abdominal aorta appear widely patent on this non CTA examination. No bulky retroperitoneal, mesenteric, pelvic or inguinal lymphadenopathy.  Normal appearance of the pelvic organs. Normal appearance of the urinary bladder given degree distention. No free fluid in the pelvic cul-de-sac.  Limited visualization of the lower thorax is negative for focal airspace opacity or pleural effusion.  Normal heart size.  No pericardial effusion.  No acute or aggressive osseus abnormalities. Mild multilevel DDD within the imaged caudal aspect of the thoracic and upper lumbar spine.  Minimal ill-defined linear stranding about the ventral aspect of the right upper abdominal wall (representative image 31, series 2) and about the umbilicus likely the sequela of recent  laparoscopic cholecystectomy. Regional soft tissues appear otherwise normal.  IMPRESSION: 1. No explanation for patient's worsening abdominal pain. 2. Post cholecystectomy without evidence of complication. 3. Age advanced fatty atrophy of the pancreas without associated peripancreatic stranding.   Electronically Signed   By: Sandi Mariscal M.D.   On: 01/30/2014 20:41   9:21 PM Patient awake and alert.  Vital signs remained consistent. I discussed all findings with the patient, his mother, his father. Patient states that he is speaking with his physicians tomorrow for continued evaluation as an outpatient. Patient was provided additional resources for continued evaluation as an outpatient.  MDM   Final diagnoses:  Generalized abdominal pain    Patient with chronic abdominal issues now presents with ongoing pain, occasional episodes of blood with stool. Patient's vital signs are reassuring, with no evidence for  hemorrhagic shock.  Lab values are similarly reassuring, as is the patient's CT scan. Patient has no evidence for peritonitis on physical exam. Patient has a gastrin neurology, and is arranging follow-up with an additional team for further evaluation and management.  Absent distress, with reassuring findings, he was discharged in stable condition to follow-up as an outpatient tomorrow.  I personally performed the services described in this documentation, which was scribed in my presence. The recorded information has been reviewed and is accurate.    Carmin Muskrat, MD 01/30/14 2122

## 2014-01-30 NOTE — ED Notes (Addendum)
Two week ago pt had increase in abdominal pain after GB removal. Pt states the pain got so worse he passed out at work. Pt states that since lunch today he has had diarrhea x2 with bright red blood on first stool and dark red on second BM.  Pt states he has been feeling dizzy and lightheaded. Pt denies being on blood thinners. Pt states his "entire" abdomen is hurting.   Before getting GB removed, pt had several polyps removed from colon.  During procedure, per pt, Dr. Joanne Gavel his bowels.

## 2014-03-14 ENCOUNTER — Other Ambulatory Visit: Payer: Self-pay

## 2014-03-14 ENCOUNTER — Ambulatory Visit (INDEPENDENT_AMBULATORY_CARE_PROVIDER_SITE_OTHER): Payer: BLUE CROSS/BLUE SHIELD | Admitting: Nurse Practitioner

## 2014-03-14 ENCOUNTER — Encounter: Payer: Self-pay | Admitting: Nurse Practitioner

## 2014-03-14 ENCOUNTER — Telehealth: Payer: Self-pay | Admitting: Nurse Practitioner

## 2014-03-14 VITALS — BP 143/92 | HR 77 | Temp 98.2°F | Ht 69.0 in | Wt 298.4 lb

## 2014-03-14 DIAGNOSIS — K219 Gastro-esophageal reflux disease without esophagitis: Secondary | ICD-10-CM

## 2014-03-14 DIAGNOSIS — K625 Hemorrhage of anus and rectum: Secondary | ICD-10-CM

## 2014-03-14 DIAGNOSIS — K5900 Constipation, unspecified: Secondary | ICD-10-CM

## 2014-03-14 DIAGNOSIS — K5909 Other constipation: Secondary | ICD-10-CM | POA: Insufficient documentation

## 2014-03-14 DIAGNOSIS — R1084 Generalized abdominal pain: Secondary | ICD-10-CM

## 2014-03-14 DIAGNOSIS — R109 Unspecified abdominal pain: Secondary | ICD-10-CM | POA: Insufficient documentation

## 2014-03-14 DIAGNOSIS — R197 Diarrhea, unspecified: Secondary | ICD-10-CM

## 2014-03-14 LAB — COMPREHENSIVE METABOLIC PANEL
ALK PHOS: 56 U/L (ref 39–117)
ALT: 47 U/L (ref 0–53)
AST: 23 U/L (ref 0–37)
Albumin: 3.9 g/dL (ref 3.5–5.2)
BILIRUBIN TOTAL: 0.4 mg/dL (ref 0.2–1.2)
BUN: 14 mg/dL (ref 6–23)
CO2: 26 meq/L (ref 19–32)
CREATININE: 0.79 mg/dL (ref 0.50–1.35)
Calcium: 9.1 mg/dL (ref 8.4–10.5)
Chloride: 104 mEq/L (ref 96–112)
Glucose, Bld: 85 mg/dL (ref 70–99)
Potassium: 4.5 mEq/L (ref 3.5–5.3)
SODIUM: 138 meq/L (ref 135–145)
Total Protein: 6.7 g/dL (ref 6.0–8.3)

## 2014-03-14 LAB — CBC
HEMATOCRIT: 50.3 % (ref 39.0–52.0)
Hemoglobin: 17.2 g/dL — ABNORMAL HIGH (ref 13.0–17.0)
MCH: 29.9 pg (ref 26.0–34.0)
MCHC: 34.2 g/dL (ref 30.0–36.0)
MCV: 87.5 fL (ref 78.0–100.0)
MPV: 12.5 fL — ABNORMAL HIGH (ref 8.6–12.4)
Platelets: 245 10*3/uL (ref 150–400)
RBC: 5.75 MIL/uL (ref 4.22–5.81)
RDW: 13.4 % (ref 11.5–15.5)
WBC: 7.8 10*3/uL (ref 4.0–10.5)

## 2014-03-14 LAB — LIPASE: LIPASE: 13 U/L (ref 0–75)

## 2014-03-14 MED ORDER — NITROGLYCERIN 0.4 % RE OINT: 1.0000 "application " | TOPICAL_OINTMENT | Freq: Two times a day (BID) | RECTAL | Status: DC

## 2014-03-14 MED ORDER — PEG 3350-KCL-NA BICARB-NACL 420 G PO SOLR: 4000.0000 mL | Freq: Once | ORAL | Status: DC

## 2014-03-14 MED ORDER — HYDROCORTISONE 2.5 % RE CREA: 1.0000 "application " | TOPICAL_CREAM | Freq: Two times a day (BID) | RECTAL | Status: DC

## 2014-03-14 NOTE — Assessment & Plan Note (Signed)
History of GERD for which the patient was started on Prevacid which he states made his pain worse. At some other point Carafate was added. Patient stopped both medications on his own and has been managing with frequent milk of magnesia. Will stop the MoM and add PPI (Prilosec 20mg  daily before the first meal) for tolerance and symptom improvement. Can titrate thsi up and possibly add carafate in the future if needed. In the meantime will add EGD to his colonoscopy to evaluate for gastritis/esophagitis or other acute process that may help explain his abdominal pain and other GI symptoms.  Proceed with TCS and EGD with Dr. Gala Romney in near future: the risks, benefits, and alternatives have been discussed with the patient in detail. The patient states understanding and desires to proceed.

## 2014-03-14 NOTE — Assessment & Plan Note (Addendum)
Patient with persistent and recurrent rectal bleeding with rectal pain at every bowel movement. Likely benign anorectal source with possible fissure but cannot exclude more insidious process. Will send in an Rx for nitroglycerin ointment PR bid for 3 weeks alternating with Anusol bid x 7 days. Will also proceed with a colonoscopy to evaluate for more insidious process. Also with a history of polypectomy x 4 at age 29.  Proceed with TCS and EGD with Dr. Gala Romney in near future: the risks, benefits, and alternatives have been discussed with the patient in detail. The patient states understanding and desires to proceed.

## 2014-03-14 NOTE — Progress Notes (Signed)
Primary Care Physician:  No PCP Per Patient Primary Gastroenterologist:  Dr. Gala Romney  Chief Complaint  Patient presents with  . Gas  . Rectal Bleeding  . Hemorrhoids    HPI:   29 year old male presents for rectal bleeding. Began having abdominal pain in September and began trial on PPI with no improvement. Subsequently had EGD and colonoscopy. The EGD (per patient) was unremarkable except for hiatal hernia and the colonoscopy has polypectomy x 4. About 5 or 6 hours after the colonoscopy patient brought back to the ER severe pain with eating and determined to be colon perforation. Was admitted for one week on antibiotics and NPO with diet advance. Perforation determined to be healed. Did well for a couple weeks then began having more abdominal pain. Evaluated and determined to have chronic cholecystitis and subsequent lap chole on 12/26/13. About 2 weeks post-op began to have more rectal bleeding, follow-up with surgeon done and was told the issues wasn't surgical. Follow-up with Dr. Britta Mccreedy and determined likely internal hemorrhoids and no treatment provided.  Also currently having postprandial abdominal pain occasionally which is described as crampy and sharp in the RUQ. The bleeding ranges from bright red to dark red though usually more bright red. In November was brought to [facility] for "almost passing out because I lost so much blood" with subsequent referral to our office. Review of ER records from this date show normal H/H.  Currently still having symptoms, bleeding is described as "heavy bleeding" about twice a week with other occurences during the week which is lighter, sometimes a little as just noticed on the toilet paper.  When he has a bowel movement has sharp rectal pain. Is currently taking milk of magnesia which helps his abdominal pain. Stopped taking Prevacid because he states it made his abdominal pain worse. Also stopped taking Carafate. Not taking any other laxatives or acid  reducers. His abdominal pain improves with a bowel movement. Typically has a BM 2-3 times a day which is typically a 7 on the [place] stool scale while taking milk of mag. When he isn't taking this he gets constipated, even if he misses one dose. Denies dysphagia. Has had a decreased appetite due to pain. Weight within 5-6 lbs since ED treatment end of November. Also complains of gas build-up which sometimes improves with a bowel movement.  Denies any other upper of lower GI symptoms.  Past Medical History  Diagnosis Date  . Chronic back pain   . Colon polyps 2015    Past Surgical History  Procedure Laterality Date  . Knee surgery    . Cholecystectomy  12/26/13  . Colonoscopy  11/2013  . Esophagogastroduodenoscopy  11/2013    Current Outpatient Prescriptions  Medication Sig Dispense Refill  . magnesium hydroxide (MILK OF MAGNESIA) 400 MG/5ML suspension Take 30 mLs by mouth 2 (two) times daily.     . Simethicone (GAS-X MAXIMUM STRENGTH PO) Take 1 tablet by mouth daily as needed. Gas discomfort    . lansoprazole (PREVACID) 30 MG capsule Take 1 capsule (30 mg total) by mouth daily. (Patient not taking: Reported on 03/14/2014) 30 capsule 0  . oxyCODONE-acetaminophen (PERCOCET/ROXICET) 5-325 MG per tablet Take 1 tablet by mouth every 6 (six) hours as needed for severe pain. (Patient not taking: Reported on 03/14/2014) 15 tablet 0  . Probiotic Product (PROBIOTIC DAILY PO) Take 1 capsule by mouth daily.    Marland Kitchen senna (SENOKOT) 8.6 MG TABS Take 1 tablet by mouth daily.    Marland Kitchen  sucralfate (CARAFATE) 1 GM/10ML suspension Take 10 mLs (1 g total) by mouth 4 (four) times daily -  with meals and at bedtime. (Patient not taking: Reported on 03/14/2014) 420 mL 0   No current facility-administered medications for this visit.    Allergies as of 03/14/2014  . (No Known Allergies)    No family history on file.  History   Social History  . Marital Status: Married    Spouse Name: N/A    Number of Children:  N/A  . Years of Education: N/A   Occupational History  . Not on file.   Social History Main Topics  . Smoking status: Former Smoker -- 1.00 packs/day    Types: Cigarettes  . Smokeless tobacco: Not on file  . Alcohol Use: No     Comment: one a month  . Drug Use: No  . Sexual Activity: Not on file   Other Topics Concern  . Not on file   Social History Narrative    Review of Systems: Gen: Denies any fever, chills, fatigue, weight loss, lack of appetite.  CV: Denies chest pain, heart palpitations, peripheral edema, syncope.  Resp: Denies shortness of breath at rest or with exertion. Denies wheezing or cough.  GI: Denies dysphagia or odynophagia. Denies jaundice, hematemesis, fecal incontinence. MS: Denies joint pain, muscle weakness, cramps, or limitation of movement.  Derm: Denies rash, itching, dry skin Psych: Denies depression, anxiety, memory loss, and confusion Heme: Denies bruising, bleeding, and enlarged lymph nodes.  Physical Exam: BP 143/92 mmHg  Pulse 77  Temp(Src) 98.2 F (36.8 C) (Oral)  Ht 5\' 9"  (1.753 m)  Wt 298 lb 6.4 oz (135.353 kg)  BMI 44.05 kg/m2 General:   Alert and oriented. Pleasant and cooperative. Well-nourished and well-developed.  Head:  Normocephalic and atraumatic. Eyes:  Without icterus, sclera clear and conjunctiva pink.  Ears:  Normal auditory acuity. Mouth:  No deformity or lesions, oral mucosa pink. No OP edema Neck:  Supple, without mass or thyromegaly. Lungs:  Clear to auscultation bilaterally. No wheezes, rales, or rhonchi. No distress.  Heart:  S1, S2 present without murmurs appreciated.  Abdomen:  +BS, soft, non-tender and non-distended. No HSM noted. No guarding or rebound. No masses appreciated.  Rectal:  Deferred  Msk:  Symmetrical without gross deformities. Normal posture. Pulses:  Normal DP pulses noted. Extremities:  Without clubbing or edema. Neurologic:  Alert and  oriented x4;  grossly normal neurologically. Skin:  Intact  without significant lesions or rashes. Cervical Nodes:  No significant cervical adenopathy. Psych:  Alert and cooperative. Normal mood and affect.     03/14/2014 8:50 AM

## 2014-03-14 NOTE — Assessment & Plan Note (Signed)
Abdominal pain which is typically relieved with a bowel movement. Also c/o constipation if he does not take MoM regularly during the day. Pain is crampy and sharp. History of polypectomy x 4 at age 29. Also has a history of GERD which failed prevacid, which he stopped taking. Also stopped taking Rx carafate. Abdominal pain could be due to IBS-C or chronic GERD. Will trial Amitiza for better control of his lower GI symptoms and restart PPI (Prilosec 20mg  daily 30 mins before first meal of the day) for improved tolerance and symptoms. Will also plan for EGD to be added to his colonoscopy to evaluate for gastritis/esophagitis and other more acute processes.  Proceed with TCS and EGD with Dr. Gala Romney in near future: the risks, benefits, and alternatives have been discussed with the patient in detail. The patient states understanding and desires to proceed.

## 2014-03-14 NOTE — Patient Instructions (Addendum)
1. We will set you up for a repeat colonoscopy and EGD with Dr. Gala Romney 2. Please have your labs drawn today or within the next couple days. 3. Stop taking the chronic milk of mag 4. Add Prilosec 20mg  once daily before your first meal of the day 5. We will give you samples of Amatiza to try for the constipation off milk of mag. 6. Further recommendations to follow based on the results of the procedure. 7. Will also add an ointment to apply rectally twice a day for 3 weeks for the rectal pain with bowel movements. For the first 3 days also alternate this with Anusol ointment twice a day for a total of 7 days.

## 2014-03-14 NOTE — Telephone Encounter (Signed)
Please call the patient and tell him I've also added a couple creams to apply for his rectal pain to see if it helps his symptoms while we wait for the procedures. The Anusol he uses twice a day for 7 days. The nitroglycerin he uses twice a day for 3 weeks. While he's taking them both he should alternate them.

## 2014-03-14 NOTE — Progress Notes (Signed)
No pcp per patient 

## 2014-03-14 NOTE — Telephone Encounter (Signed)
Tried to call pt- NA and no voicemail. 

## 2014-03-14 NOTE — Assessment & Plan Note (Signed)
States he has constipation with abdominal pain unless he takes milk of magnesia throughout the day. Will stop the MoM and trial Amitiza for better control of his symptoms. Also follow-up with a colonoscopy and EGD with Dr. Gala Romney to rule out more acute process. Has a history of polypectomy x 4 at age 29.  Proceed with TCS and EGD with Dr. Gala Romney in near future: the risks, benefits, and alternatives have been discussed with the patient in detail. The patient states understanding and desires to proceed.

## 2014-03-16 NOTE — Telephone Encounter (Signed)
Pt is aware. He has not picked them up yet. The total cost was $80.00 and he has to wait until he gets paid. He is aware of the instructions.

## 2014-03-20 ENCOUNTER — Telehealth: Payer: Self-pay | Admitting: Internal Medicine

## 2014-03-20 NOTE — Telephone Encounter (Signed)
Patient called this morning. He was seen on 03/14/14 by EG and said that the medicine he was put on isn't helping him. He is having severe gas pains with constipation. Please advise and call him back at [phone]

## 2014-03-21 NOTE — Telephone Encounter (Signed)
Spoke with the pt- he said the [place] was causing him to have a lot of gas and pain in his chest. He still was not having a bm. Advised pt to stop taking the amitiza and I would speak with EG and get back with the pt tomorrow. He is currently at the hospital because his wife had a baby last night. He said if he doesn't answer his phone to keep trying to call back.

## 2014-03-22 NOTE — Telephone Encounter (Signed)
Can you find out when his last bowel movement was?

## 2014-03-22 NOTE — Telephone Encounter (Signed)
His last bm had been Sunday until he took a dose of MOM last night because he couldn't stand it anymore. He said he was up and down all night.

## 2014-03-23 NOTE — Telephone Encounter (Signed)
Pt is aware. Samples are at the front desk for him to pick up.

## 2014-03-23 NOTE — Telephone Encounter (Signed)
We can try Linzess 170mcg once daily. He can pick up 2 weeks worth of samples to trial and see if that works any better.

## 2014-03-29 ENCOUNTER — Ambulatory Visit (HOSPITAL_COMMUNITY)
Admission: RE | Admit: 2014-03-29 | Discharge: 2014-03-29 | Disposition: A | Discharge status: Home / Self Care | Payer: BLUE CROSS/BLUE SHIELD | Source: Ambulatory Visit | Attending: Internal Medicine | Admitting: Internal Medicine

## 2014-03-29 ENCOUNTER — Encounter (HOSPITAL_COMMUNITY): Payer: Self-pay | Admitting: *Deleted

## 2014-03-29 ENCOUNTER — Encounter (HOSPITAL_COMMUNITY)
Admission: RE | Disposition: A | Discharge status: Home / Self Care | Payer: Self-pay | Source: Ambulatory Visit | Attending: Internal Medicine

## 2014-03-29 DIAGNOSIS — K621 Rectal polyp: Secondary | ICD-10-CM | POA: Diagnosis not present

## 2014-03-29 DIAGNOSIS — M549 Dorsalgia, unspecified: Secondary | ICD-10-CM | POA: Insufficient documentation

## 2014-03-29 DIAGNOSIS — D125 Benign neoplasm of sigmoid colon: Secondary | ICD-10-CM | POA: Insufficient documentation

## 2014-03-29 DIAGNOSIS — Z860100 Personal history of colon polyps, unspecified: Secondary | ICD-10-CM | POA: Insufficient documentation

## 2014-03-29 DIAGNOSIS — R079 Chest pain, unspecified: Secondary | ICD-10-CM

## 2014-03-29 DIAGNOSIS — R109 Unspecified abdominal pain: Secondary | ICD-10-CM

## 2014-03-29 DIAGNOSIS — G8929 Other chronic pain: Secondary | ICD-10-CM | POA: Diagnosis not present

## 2014-03-29 DIAGNOSIS — R197 Diarrhea, unspecified: Secondary | ICD-10-CM

## 2014-03-29 DIAGNOSIS — Z87891 Personal history of nicotine dependence: Secondary | ICD-10-CM | POA: Insufficient documentation

## 2014-03-29 DIAGNOSIS — K209 Esophagitis, unspecified: Secondary | ICD-10-CM

## 2014-03-29 DIAGNOSIS — K921 Melena: Secondary | ICD-10-CM

## 2014-03-29 DIAGNOSIS — K2210 Ulcer of esophagus without bleeding: Secondary | ICD-10-CM | POA: Insufficient documentation

## 2014-03-29 DIAGNOSIS — R1013 Epigastric pain: Secondary | ICD-10-CM

## 2014-03-29 DIAGNOSIS — K625 Hemorrhage of anus and rectum: Secondary | ICD-10-CM

## 2014-03-29 DIAGNOSIS — K635 Polyp of colon: Secondary | ICD-10-CM

## 2014-03-29 DIAGNOSIS — K648 Other hemorrhoids: Secondary | ICD-10-CM | POA: Insufficient documentation

## 2014-03-29 DIAGNOSIS — K5900 Constipation, unspecified: Secondary | ICD-10-CM

## 2014-03-29 HISTORY — PX: FLEXIBLE SIGMOIDOSCOPY: SHX5431

## 2014-03-29 HISTORY — PX: ESOPHAGOGASTRODUODENOSCOPY: SHX5428

## 2014-03-29 SURGERY — EGD (ESOPHAGOGASTRODUODENOSCOPY)
Anesthesia: Moderate Sedation

## 2014-03-29 MED ORDER — MEPERIDINE HCL 100 MG/ML IJ SOLN
INTRAMUSCULAR | Status: AC
  Filled 2014-03-29: qty 2

## 2014-03-29 MED ORDER — MEPERIDINE HCL 100 MG/ML IJ SOLN
INTRAMUSCULAR | Status: DC | PRN
  Administered 2014-03-29 (×3): 50 mg via INTRAVENOUS
  Administered 2014-03-29: 25 mg via INTRAVENOUS

## 2014-03-29 MED ORDER — ONDANSETRON HCL 4 MG/2ML IJ SOLN
INTRAMUSCULAR | Status: DC | PRN
  Administered 2014-03-29: 4 mg via INTRAVENOUS

## 2014-03-29 MED ORDER — ONDANSETRON HCL 4 MG/2ML IJ SOLN
INTRAMUSCULAR | Status: AC
  Filled 2014-03-29: qty 2

## 2014-03-29 MED ORDER — SODIUM CHLORIDE 0.9 % IV SOLN
INTRAVENOUS | Status: DC
  Administered 2014-03-29: 1000 mL via INTRAVENOUS

## 2014-03-29 MED ORDER — LIDOCAINE VISCOUS 2 % MT SOLN
OROMUCOSAL | Status: AC
  Filled 2014-03-29: qty 15

## 2014-03-29 MED ORDER — LIDOCAINE VISCOUS 2 % MT SOLN
OROMUCOSAL | Status: DC | PRN
  Administered 2014-03-29: 3 mL via OROMUCOSAL

## 2014-03-29 MED ORDER — MIDAZOLAM HCL 5 MG/5ML IJ SOLN
INTRAMUSCULAR | Status: DC | PRN
  Administered 2014-03-29 (×4): 2 mg via INTRAVENOUS

## 2014-03-29 MED ORDER — STERILE WATER FOR IRRIGATION IR SOLN
Status: DC | PRN
  Administered 2014-03-29: 14:00:00

## 2014-03-29 MED ORDER — MIDAZOLAM HCL 5 MG/5ML IJ SOLN
INTRAMUSCULAR | Status: AC
  Filled 2014-03-29: qty 10

## 2014-03-29 NOTE — Discharge Instructions (Addendum)
sigmoidoscopy Discharge Instructions  Read the instructions outlined below and refer to this sheet in the next few weeks. These discharge instructions provide you with general information on caring for yourself after you leave the hospital. Your doctor may also give you specific instructions. While your treatment has been planned according to the most current medical practices available, unavoidable complications occasionally occur. If you have any problems or questions after discharge, call Dr. Gala Romney at [phone]. ACTIVITY  You may resume your regular activity, but move at a slower pace for the next 24 hours.   Take frequent rest periods for the next 24 hours.   Walking will help get rid of the air and reduce the bloated feeling in your belly (abdomen).   No driving for 24 hours (because of the medicine (anesthesia) used during the test).    Do not sign any important legal documents or operate any machinery for 24 hours (because of the anesthesia used during the test).  NUTRITION  Drink plenty of fluids.   You may resume your normal diet as instructed by your doctor.   Begin with a light meal and progress to your normal diet. Heavy or fried foods are harder to digest and may make you feel sick to your stomach (nauseated).   Avoid alcoholic beverages for 24 hours or as instructed.  MEDICATIONS  You may resume your normal medications unless your doctor tells you otherwise.  WHAT YOU CAN EXPECT TODAY  Some feelings of bloating in the abdomen.   Passage of more gas than usual.   Spotting of blood in your stool or on the toilet paper.  IF YOU HAD POLYPS REMOVED DURING THE COLONOSCOPY:  No aspirin products for 7 days or as instructed.   No alcohol for 7 days or as instructed.   Eat a soft diet for the next 24 hours.  FINDING OUT THE RESULTS OF YOUR TEST Not all test results are available during your visit. If your test results are not back during the visit, make an appointment  with your caregiver to find out the results. Do not assume everything is normal if you have not heard from your caregiver or the medical facility. It is important for you to follow up on all of your test results.  SEEK IMMEDIATE MEDICAL ATTENTION IF:  You have more than a spotting of blood in your stool.   Your belly is swollen (abdominal distention).   You are nauseated or vomiting.   You have a temperature over 101.   You have abdominal pain or discomfort that is severe or gets worse throughout the day.   EGD Discharge instructions Please read the instructions outlined below and refer to this sheet in the next few weeks. These discharge instructions provide you with general information on caring for yourself after you leave the hospital. Your doctor may also give you specific instructions. While your treatment has been planned according to the most current medical practices available, unavoidable complications occasionally occur. If you have any problems or questions after discharge, please call your doctor. ACTIVITY  You may resume your regular activity but move at a slower pace for the next 24 hours.   Take frequent rest periods for the next 24 hours.   Walking will help expel (get rid of) the air and reduce the bloated feeling in your abdomen.   No driving for 24 hours (because of the anesthesia (medicine) used during the test).   You may shower.   Do not sign any  important legal documents or operate any machinery for 24 hours (because of the anesthesia used during the test).  NUTRITION  Drink plenty of fluids.   You may resume your normal diet.   Begin with a light meal and progress to your normal diet.   Avoid alcoholic beverages for 24 hours or as instructed by your caregiver.  MEDICATIONS  You may resume your normal medications unless your caregiver tells you otherwise.  WHAT YOU CAN EXPECT TODAY  You may experience abdominal discomfort such as a feeling of  fullness or gas pains.  FOLLOW-UP  Your doctor will discuss the results of your test with you.  SEEK IMMEDIATE MEDICAL ATTENTION IF ANY OF THE FOLLOWING OCCUR:  Excessive nausea (feeling sick to your stomach) and/or vomiting.   Severe abdominal pain and distention (swelling).   Trouble swallowing.   Temperature over 101 F (37.8 C).   Rectal bleeding or vomiting of blood.    GERD, constipation, hemorrhoids and polyp information provided  Please obtain the Anusol and nitroglycerin ointments previously prescribed and begin using them  Begin Zegerid 40 mg daily  Begin Linzess  for constipation-previously prescribed - take daily to every other day as needed for constipation.  Office visit with [place] in 6 weeks.  Further recommendations to follow once polyp biopsy results are back for review.  Hemorrhoids Hemorrhoids are swollen veins around the rectum or anus. There are two types of hemorrhoids:   Internal hemorrhoids. These occur in the veins just inside the rectum. They may poke through to the outside and become irritated and painful.  External hemorrhoids. These occur in the veins outside the anus and can be felt as a painful swelling or hard lump near the anus. CAUSES  Pregnancy.   Obesity.   Constipation or diarrhea.   Straining to have a bowel movement.   Sitting for long periods on the toilet.  Heavy lifting or other activity that caused you to strain.  Anal intercourse. SYMPTOMS   Pain.   Anal itching or irritation.   Rectal bleeding.   Fecal leakage.   Anal swelling.   One or more lumps around the anus.  DIAGNOSIS  Your caregiver may be able to diagnose hemorrhoids by visual examination. Other examinations or tests that may be performed include:   Examination of the rectal area with a gloved hand (digital rectal exam).   Examination of anal canal using a small tube (scope).   A blood test if you have lost a significant amount of  blood.  A test to look inside the colon (sigmoidoscopy or colonoscopy). TREATMENT Most hemorrhoids can be treated at home. However, if symptoms do not seem to be getting better or if you have a lot of rectal bleeding, your caregiver may perform a procedure to help make the hemorrhoids get smaller or remove them completely. Possible treatments include:   Placing a rubber band at the base of the hemorrhoid to cut off the circulation (rubber band ligation).   Injecting a chemical to shrink the hemorrhoid (sclerotherapy).   Using a tool to burn the hemorrhoid (infrared light therapy).   Surgically removing the hemorrhoid (hemorrhoidectomy).   Stapling the hemorrhoid to block blood flow to the tissue (hemorrhoid stapling).  HOME CARE INSTRUCTIONS   Eat foods with fiber, such as whole grains, beans, nuts, fruits, and vegetables. Ask your doctor about taking products with added fiber in them (fibersupplements).  Increase fluid intake. Drink enough water and fluids to keep your urine clear or pale  yellow.   Exercise regularly.   Go to the bathroom when you have the urge to have a bowel movement. Do not wait.   Avoid straining to have bowel movements.   Keep the anal area dry and clean. Use wet toilet paper or moist towelettes after a bowel movement.   Medicated creams and suppositories may be used or applied as directed.   Only take over-the-counter or prescription medicines as directed by your caregiver.   Take warm sitz baths for 15-20 minutes, 3-4 times a day to ease pain and discomfort.   Place ice packs on the hemorrhoids if they are tender and swollen. Using ice packs between sitz baths may be helpful.   Put ice in a plastic bag.   Place a towel between your skin and the bag.   Leave the ice on for 15-20 minutes, 3-4 times a day.   Do not use a donut-shaped pillow or sit on the toilet for long periods. This increases blood pooling and pain.  SEEK MEDICAL  CARE IF:  You have increasing pain and swelling that is not controlled by treatment or medicine.  You have uncontrolled bleeding.  You have difficulty or you are unable to have a bowel movement.  You have pain or inflammation outside the area of the hemorrhoids. MAKE SURE YOU:  Understand these instructions.  Will watch your condition.  Will get help right away if you are not doing well or get worse. Document Released: 02/15/2000 Document Revised: 02/04/2012 Document Reviewed: 12/23/2011 [facility] Patient Information 2015 [place], [place]. This information is not intended to replace advice given to you by your health care provider. Make sure you discuss any questions you have with your health care provider.    Colon Polyps Polyps are lumps of extra tissue growing inside the body. Polyps can grow in the large intestine (colon). Most colon polyps are noncancerous (benign). However, some colon polyps can become cancerous over time. Polyps that are larger than a pea may be harmful. To be safe, caregivers remove and test all polyps. CAUSES  Polyps form when mutations in the genes cause your cells to grow and divide even though no more tissue is needed. RISK FACTORS There are a number of risk factors that can increase your chances of getting colon polyps. They include:  Being older than 50 years.  Family history of colon polyps or colon cancer.  Long-term colon diseases, such as colitis or Crohn disease.  Being overweight.  Smoking.  Being inactive.  Drinking too much alcohol. SYMPTOMS  Most small polyps do not cause symptoms. If symptoms are present, they may include:  Blood in the stool. The stool may look dark red or black.  Constipation or diarrhea that lasts longer than 1 week. DIAGNOSIS People often do not know they have polyps until their caregiver finds them during a regular checkup. Your caregiver can use 4 tests to check for polyps:  Digital rectal exam. The  caregiver wears gloves and feels inside the rectum. This test would find polyps only in the rectum.  Barium enema. The caregiver puts a liquid called barium into your rectum before taking X-rays of your colon. Barium makes your colon look white. Polyps are dark, so they are easy to see in the X-ray pictures.  Sigmoidoscopy. A thin, flexible tube (sigmoidoscope) is placed into your rectum. The sigmoidoscope has a light and tiny camera in it. The caregiver uses the sigmoidoscope to look at the last third of your colon.  Colonoscopy. This  test is like sigmoidoscopy, but the caregiver looks at the entire colon. This is the most common method for finding and removing polyps. TREATMENT  Any polyps will be removed during a sigmoidoscopy or colonoscopy. The polyps are then tested for cancer. PREVENTION  To help lower your risk of getting more colon polyps:  Eat plenty of fruits and vegetables. Avoid eating fatty foods.  Do not smoke.  Avoid drinking alcohol.  Exercise every day.  Lose weight if recommended by your caregiver.  Eat plenty of calcium and folate. Foods that are rich in calcium include milk, cheese, and broccoli. Foods that are rich in folate include chickpeas, kidney beans, and spinach. HOME CARE INSTRUCTIONS Keep all follow-up appointments as directed by your caregiver. You may need periodic exams to check for polyps. SEEK MEDICAL CARE IF: You notice bleeding during a bowel movement. Document Released: 11/14/2003 Document Revised: 05/12/2011 Document Reviewed: 04/29/2011 [facility] Patient Information 2015 [place], [place]. This information is not intended to replace advice given to you by your health care provider. Make sure you discuss any questions you have with your health care provider.   Constipation Constipation is when a person has fewer than three bowel movements a week, has difficulty having a bowel movement, or has stools that are dry, hard, or larger than normal. As  people grow older, constipation is more common. If you try to fix constipation with medicines that make you have a bowel movement (laxatives), the problem may get worse. Long-term laxative use may cause the muscles of the colon to become weak. A low-fiber diet, not taking in enough fluids, and taking certain medicines may make constipation worse.  CAUSES   Certain medicines, such as antidepressants, pain medicine, iron supplements, antacids, and water pills.   Certain diseases, such as diabetes, irritable bowel syndrome (IBS), thyroid disease, or depression.   Not drinking enough water.   Not eating enough fiber-rich foods.   Stress or travel.   Lack of physical activity or exercise.   Ignoring the urge to have a bowel movement.   Using laxatives too much.  SIGNS AND SYMPTOMS   Having fewer than three bowel movements a week.   Straining to have a bowel movement.   Having stools that are hard, dry, or larger than normal.   Feeling full or bloated.   Pain in the lower abdomen.   Not feeling relief after having a bowel movement.  DIAGNOSIS  Your health care provider will take a medical history and perform a physical exam. Further testing may be done for severe constipation. Some tests may include:  A barium enema X-ray to examine your rectum, colon, and, sometimes, your small intestine.   A sigmoidoscopy to examine your lower colon.   A colonoscopy to examine your entire colon. TREATMENT  Treatment will depend on the severity of your constipation and what is causing it. Some dietary treatments include drinking more fluids and eating more fiber-rich foods. Lifestyle treatments may include regular exercise. If these diet and lifestyle recommendations do not help, your health care provider may recommend taking over-the-counter laxative medicines to help you have bowel movements. Prescription medicines may be prescribed if over-the-counter medicines do not work.   HOME CARE INSTRUCTIONS   Eat foods that have a lot of fiber, such as fruits, vegetables, whole grains, and beans.  Limit foods high in fat and processed sugars, such as french fries, hamburgers, cookies, candies, and soda.   A fiber supplement may be added to your diet if you  cannot get enough fiber from foods.   Drink enough fluids to keep your urine clear or pale yellow.   Exercise regularly or as directed by your health care provider.   Go to the restroom when you have the urge to go. Do not hold it.   Only take over-the-counter or prescription medicines as directed by your health care provider. Do not take other medicines for constipation without talking to your health care provider first.  [place] IF:   You have bright red blood in your stool.   Your constipation lasts for more than 4 days or gets worse.   You have abdominal or rectal pain.   You have thin, pencil-like stools.   You have unexplained weight loss. MAKE SURE YOU:   Understand these instructions.  Will watch your condition.  Will get help right away if you are not doing well or get worse. Document Released: 11/16/2003 Document Revised: 02/22/2013 Document Reviewed: 11/29/2012 [facility] Patient Information 2015 [place], [place]. This information is not intended to replace advice given to you by your health care provider. Make sure you discuss any questions you have with your health care provider.   Gastroesophageal Reflux Disease, Adult Gastroesophageal reflux disease (GERD) happens when acid from your stomach flows up into the esophagus. When acid comes in contact with the esophagus, the acid causes soreness (inflammation) in the esophagus. Over time, GERD may create small holes (ulcers) in the lining of the esophagus. CAUSES   Increased body weight. This puts pressure on the stomach, making acid rise from the stomach into the esophagus.  Smoking. This increases acid  production in the stomach.  Drinking alcohol. This causes decreased pressure in the lower esophageal sphincter (valve or ring of muscle between the esophagus and stomach), allowing acid from the stomach into the esophagus.  Late evening meals and a full stomach. This increases pressure and acid production in the stomach.  A malformed lower esophageal sphincter. Sometimes, no cause is found. SYMPTOMS   Burning pain in the lower part of the mid-chest behind the breastbone and in the mid-stomach area. This may occur twice a week or more often.  Trouble swallowing.  Sore throat.  Dry cough.  Asthma-like symptoms including chest tightness, shortness of breath, or wheezing. DIAGNOSIS  Your caregiver may be able to diagnose GERD based on your symptoms. In some cases, X-rays and other tests may be done to check for complications or to check the condition of your stomach and esophagus. TREATMENT  Your caregiver may recommend over-the-counter or prescription medicines to help decrease acid production. Ask your caregiver before starting or adding any new medicines.  HOME CARE INSTRUCTIONS   Change the factors that you can control. Ask your caregiver for guidance concerning weight loss, quitting smoking, and alcohol consumption.  Avoid foods and drinks that make your symptoms worse, such as:  Caffeine or alcoholic drinks.  Chocolate.  Peppermint or mint flavorings.  Garlic and onions.  Spicy foods.  Citrus fruits, such as oranges, lemons, or limes.  Tomato-based foods such as sauce, chili, salsa, and pizza.  Fried and fatty foods.  Avoid lying down for the 3 hours prior to your bedtime or prior to taking a nap.  Eat small, frequent meals instead of large meals.  Wear loose-fitting clothing. Do not wear anything tight around your waist that causes pressure on your stomach.  Raise the head of your bed 6 to 8 inches with wood blocks to help you sleep. Extra pillows will  not  help.  Only take over-the-counter or prescription medicines for pain, discomfort, or fever as directed by your caregiver.  Do not take aspirin, ibuprofen, or other nonsteroidal anti-inflammatory drugs (NSAIDs). SEEK IMMEDIATE MEDICAL CARE IF:   You have pain in your arms, neck, jaw, teeth, or back.  Your pain increases or changes in intensity or duration.  You develop nausea, vomiting, or sweating (diaphoresis).  You develop shortness of breath, or you faint.  Your vomit is green, yellow, black, or looks like coffee grounds or blood.  Your stool is red, bloody, or black. These symptoms could be signs of other problems, such as heart disease, gastric bleeding, or esophageal bleeding. MAKE SURE YOU:   Understand these instructions.  Will watch your condition.  Will get help right away if you are not doing well or get worse. Document Released: 11/27/2004 Document Revised: 05/12/2011 Document Reviewed: 09/06/2010 [facility] Patient Information 2015 [place], [place]. This information is not intended to replace advice given to you by your health care provider. Make sure you discuss any questions you have with your health care provider.

## 2014-03-29 NOTE — Interval H&P Note (Signed)
History and Physical Interval Note:  03/29/2014 2:16 PM  Antonio Griffith  has presented today for surgery, with the diagnosis of rectal bleeding, abd pain, constipation, diarrhea  The various methods of treatment have been discussed with the patient and family. After consideration of risks, benefits and other options for treatment, the patient has consented to  Procedure(s): COLONOSCOPY (N/A) ESOPHAGOGASTRODUODENOSCOPY (EGD) (N/A) as a surgical intervention .  The patient's history has been reviewed, patient examined, no change in status, stable for surgery.  I have reviewed the patient's chart and labs.  Questions were answered to the patient's satisfaction.     [name]  Continue the ongoing epigastric and chest pain. No PPI. No dysphagia. Records from [place] arrived. 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 He did have perforation as documented on CT. He got by with a week's worth of conservative treatment with antibiotics. Did not require surgery. Ongoing gross blood per rectum, however, hemoglobin remains well into the normal range. Did not start taking Linzess ,Anusol or nitroglycerin as of yet  As discussed with the patient today. Full colonoscopy is not indicated however, he ought to have a sigmoidoscopy to look at his hemorrhoids and distal lower GI tract. I agree with the concept of him coming back in 3 years for full colonoscopy given path findings from last year. Also, EGD warranted today. This approach has been discussed patient at length. He is agreeable. Risks, benefits, limitations, alternatives and imponderables have been reviewed.

## 2014-03-29 NOTE — H&P (View-Only) (Signed)
Primary Care Physician:  No PCP Per Patient Primary Gastroenterologist:  Dr. Gala Romney  Chief Complaint  Patient presents with  . Gas  . Rectal Bleeding  . Hemorrhoids    HPI:   29 year old male presents for rectal bleeding. Began having abdominal pain in September and began trial on PPI with no improvement. Subsequently had EGD and colonoscopy. The EGD (per patient) was unremarkable except for hiatal hernia and the colonoscopy has polypectomy x 4. About 5 or 6 hours after the colonoscopy patient brought back to the ER severe pain with eating and determined to be colon perforation. Was admitted for one week on antibiotics and NPO with diet advance. Perforation determined to be healed. Did well for a couple weeks then began having more abdominal pain. Evaluated and determined to have chronic cholecystitis and subsequent lap chole on 12/26/13. About 2 weeks post-op began to have more rectal bleeding, follow-up with surgeon done and was told the issues wasn't surgical. Follow-up with Dr. Britta Mccreedy and determined likely internal hemorrhoids and no treatment provided.  Also currently having postprandial abdominal pain occasionally which is described as crampy and sharp in the RUQ. The bleeding ranges from bright red to dark red though usually more bright red. In November was brought to [facility] for "almost passing out because I lost so much blood" with subsequent referral to our office. Review of ER records from this date show normal H/H.  Currently still having symptoms, bleeding is described as "heavy bleeding" about twice a week with other occurences during the week which is lighter, sometimes a little as just noticed on the toilet paper.  When he has a bowel movement has sharp rectal pain. Is currently taking milk of magnesia which helps his abdominal pain. Stopped taking Prevacid because he states it made his abdominal pain worse. Also stopped taking Carafate. Not taking any other laxatives or acid  reducers. His abdominal pain improves with a bowel movement. Typically has a BM 2-3 times a day which is typically a 7 on the Bristol stool scale while taking milk of mag. When he isn't taking this he gets constipated, even if he misses one dose. Denies dysphagia. Has had a decreased appetite due to pain. Weight within 5-6 lbs since ED treatment end of November. Also complains of gas build-up which sometimes improves with a bowel movement.  Denies any other upper of lower GI symptoms.  Past Medical History  Diagnosis Date  . Chronic back pain   . Colon polyps 2015    Past Surgical History  Procedure Laterality Date  . Knee surgery    . Cholecystectomy  12/26/13  . Colonoscopy  11/2013  . Esophagogastroduodenoscopy  11/2013    Current Outpatient Prescriptions  Medication Sig Dispense Refill  . magnesium hydroxide (MILK OF MAGNESIA) 400 MG/5ML suspension Take 30 mLs by mouth 2 (two) times daily.     . Simethicone (GAS-X MAXIMUM STRENGTH PO) Take 1 tablet by mouth daily as needed. Gas discomfort    . lansoprazole (PREVACID) 30 MG capsule Take 1 capsule (30 mg total) by mouth daily. (Patient not taking: Reported on 03/14/2014) 30 capsule 0  . oxyCODONE-acetaminophen (PERCOCET/ROXICET) 5-325 MG per tablet Take 1 tablet by mouth every 6 (six) hours as needed for severe pain. (Patient not taking: Reported on 03/14/2014) 15 tablet 0  . Probiotic Product (PROBIOTIC DAILY PO) Take 1 capsule by mouth daily.    Marland Kitchen senna (SENOKOT) 8.6 MG TABS Take 1 tablet by mouth daily.    Marland Kitchen  sucralfate (CARAFATE) 1 GM/10ML suspension Take 10 mLs (1 g total) by mouth 4 (four) times daily -  with meals and at bedtime. (Patient not taking: Reported on 03/14/2014) 420 mL 0   No current facility-administered medications for this visit.    Allergies as of 03/14/2014  . (No Known Allergies)    No family history on file.  History   Social History  . Marital Status: Married    Spouse Name: N/A    Number of Children:  N/A  . Years of Education: N/A   Occupational History  . Not on file.   Social History Main Topics  . Smoking status: Former Smoker -- 1.00 packs/day    Types: Cigarettes  . Smokeless tobacco: Not on file  . Alcohol Use: No     Comment: one a month  . Drug Use: No  . Sexual Activity: Not on file   Other Topics Concern  . Not on file   Social History Narrative    Review of Systems: Gen: Denies any fever, chills, fatigue, weight loss, lack of appetite.  CV: Denies chest pain, heart palpitations, peripheral edema, syncope.  Resp: Denies shortness of breath at rest or with exertion. Denies wheezing or cough.  GI: Denies dysphagia or odynophagia. Denies jaundice, hematemesis, fecal incontinence. MS: Denies joint pain, muscle weakness, cramps, or limitation of movement.  Derm: Denies rash, itching, dry skin Psych: Denies depression, anxiety, memory loss, and confusion Heme: Denies bruising, bleeding, and enlarged lymph nodes.  Physical Exam: BP 143/92 mmHg  Pulse 77  Temp(Src) 98.2 F (36.8 C) (Oral)  Ht 5\' 9"  (1.753 m)  Wt 298 lb 6.4 oz (135.353 kg)  BMI 44.05 kg/m2 General:   Alert and oriented. Pleasant and cooperative. Well-nourished and well-developed.  Head:  Normocephalic and atraumatic. Eyes:  Without icterus, sclera clear and conjunctiva pink.  Ears:  Normal auditory acuity. Mouth:  No deformity or lesions, oral mucosa pink. No OP edema Neck:  Supple, without mass or thyromegaly. Lungs:  Clear to auscultation bilaterally. No wheezes, rales, or rhonchi. No distress.  Heart:  S1, S2 present without murmurs appreciated.  Abdomen:  +BS, soft, non-tender and non-distended. No HSM noted. No guarding or rebound. No masses appreciated.  Rectal:  Deferred  Msk:  Symmetrical without gross deformities. Normal posture. Pulses:  Normal DP pulses noted. Extremities:  Without clubbing or edema. Neurologic:  Alert and  oriented x4;  grossly normal neurologically. Skin:  Intact  without significant lesions or rashes. Cervical Nodes:  No significant cervical adenopathy. Psych:  Alert and cooperative. Normal mood and affect.     03/14/2014 8:50 AM

## 2014-03-29 NOTE — Op Note (Addendum)
NAMEJAEVON, Antonio Griffith               ACCOUNT NO.:  [id number]  MEDICAL RECORD NO.:  [id number]  LOCATION:  [facility]                          FACILITY:  [facility]  PHYSICIAN:  R. Garfield Cornea, MD [place]:  1986-01-22  DATE OF PROCEDURE:  03/29/2014 DATE OF DISCHARGE:                              OPERATIVE REPORT   PROCEDURE:  Sigmoidoscopy with biopsy.  INDICATIONS FOR PROCEDURE:  A pleasant 29 year old gentleman with paper hematochezia.  Hemoglobin remains normal.  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  Unfortunately, colonoscopy complicated by perforation which was managed conservatively.  He was originally slated to have a colonoscopy today; however, upon reassessment and interview with the patient today and his persistently normal hemoglobin and hematocrit, it was felt best to survey his distal lower GI tract via a sigmoidoscopy.  I certainly do agree that he should return in 3 years to have a surveillance colonoscopy given adenomas removed from his ascending colon.  Sigmoidoscopy now being done, risks, benefits, limitations, alternatives, imponderables have been discussed, questions answered.  Conscious sedation with Versed 8 mg IV and Demerol 175 mg IV in divided doses and Zofran 4 mg IV.  INSTRUMENT:  Pentax video chip colonoscope.  PROCEDURE IN DETAIL:  A digital rectal exam revealed no abnormalities.  Endoscopic Findings:  Prep was good.  Examination of rectal mucosa including retroflexed view of the anal verge and on - face view of imately the anal canal demonstrated friable anal canal hemorrhoids.  The patient also had internal component.  The patient had a single diminutive polyp at 10 cm from the anal verge.  Retroflexion examination demonstrated no other abnormalities.  Scope was advanced in a nice one-to-one fashion to the level of 40 cm.  I believe, most of the sigmoid colon  was reached from this level.  Scope was slowly withdrawn.  All previously mentioned surfaces were again seen.  The patient had a second diminutive polyp in the mid sigmoid segment.  Otherwise, the remainder of colonic mucosa surveyed appeared normal.  The above-mentioned polyps were cold biopsy/removed.  The patient tolerated the procedure well.  IMPRESSION:  Anal canal internal hemorrhoids, likely source of paper hematochezia.  Diminutive rectal and sigmoid polyps were removed as described above.  RECOMMENDATIONS: 1. The patient has not yet started Linzess as previously prescribed.     He was prescribed a lower dose i.e. 145 daily.  Recently, we have     urged him to begin medications as previously instructed. 2. In addition, the patient has not gotten Anusol or nitroglycerin     prescription filled.  I have urged him to go ahead and do that and     take this medication alternatingly as previously prescribed.     Office visit with [place] in 6-8 weeks. 3. See EGD report.     Bridgette Habermann, MD Quentin Ore     RMR/MEDQ  D:  03/29/2014  T:  03/29/2014  Job:  [id number]

## 2014-03-29 NOTE — Op Note (Signed)
[facility] [address] [place], [address]   ENDOSCOPY PROCEDURE REPORT  PATIENT: Antonio Griffith, Antonio Griffith  MR#: [id number] BIRTHDATE: [birth year] , 28  yrs. old GENDER: male ENDOSCOPIST: R.  Garfield Cornea, MD FACP [facility] REFERRED BY: PROCEDURE DATE:  03/31/2014 PROCEDURE:  EGD, diagnostic INDICATIONS:  epigastric/chest pain. MEDICATIONS: Versed 6 mg IV and Demerol 150 mg IV in divided doses. Xylocaine gel orally.  Zofran 4 mg IV. ASA CLASS:      Class II  CONSENT: The risks, benefits, limitations, alternatives and imponderables have been discussed.  The potential for biopsy, esophogeal dilation, etc. have also been reviewed.  Questions have been answered.  All parties agreeable.  Please see the history and physical in the medical record for more information.  DESCRIPTION OF PROCEDURE: After the risks benefits and alternatives of the procedure were thoroughly explained, informed consent was obtained.  The EG-[id number] ([id number]) endoscope was introduced through the mouth and advanced to the second portion of the duodenum , limited by Without limitations. The instrument was slowly withdrawn as the mucosa was fully examined.    Circumferential distal esophageal erosions within 2-3 mm the GE junction.  No Barrett's esophagus.  Patent esophagus throughout its course.  Stomach is empty.  Examination gastric mucosa revealed no abnormalities.  No hiatal hernia appreciated.  Patent pylorus. Normal first and second portion of the duodenum.  Retroflexed views revealed no abnormalities.     The scope was then withdrawn from the patient and the procedure completed.  COMPLICATIONS: There were no immediate complications.  ENDOSCOPIC IMPRESSION: Distal esophageal erosions consistent with mild erosive reflux esophagitis; otherwise normal EGD  RECOMMENDATIONS: Begin Zegerid 40 mg daily?"prescription provided.  See sigmoidoscopy report.  REPEAT EXAM:  eSigned:  R. Garfield Cornea,  MD Rosalita Chessman [facility] 03/31/14 2:47 PM    CC:  CPT CODES: ICD CODES:  The ICD and CPT codes recommended by this software are interpretations from the data that the clinical staff has captured with the software.  The verification of the translation of this report to the ICD and CPT codes and modifiers is the sole responsibility of the health care institution and practicing physician where this report was generated.  [employer] will not be held responsible for the validity of the ICD and CPT codes included on this report.  AMA assumes no liability for data contained or not contained herein. CPT is a Designer, television/film set of the [employer].  PATIENT NAME:  Jerelle, Virden MR#: [id number]

## 2014-03-31 ENCOUNTER — Encounter: Payer: Self-pay | Admitting: Internal Medicine

## 2014-03-31 ENCOUNTER — Encounter (HOSPITAL_COMMUNITY): Payer: Self-pay | Admitting: Internal Medicine

## 2014-04-03 ENCOUNTER — Telehealth: Payer: Self-pay

## 2014-04-03 NOTE — Telephone Encounter (Signed)
Letter mailed to the pt. 

## 2014-04-03 NOTE — Telephone Encounter (Signed)
HAS APPOINTMENT AND ON RECALL FOR [facility]

## 2014-04-03 NOTE — Telephone Encounter (Signed)
Per RMR-  Send letter to patient.  Send copy of letter with path to referring provider and PCP.   He should have an office visit with [place]. I would put him on a calendar for 3 year followup colonoscopy as well

## 2014-04-21 ENCOUNTER — Telehealth: Payer: Self-pay | Admitting: Internal Medicine

## 2014-04-21 MED ORDER — OMEPRAZOLE-SODIUM BICARBONATE 40-1100 MG PO CAPS: 1.0000 | ORAL_CAPSULE | Freq: Every day | ORAL | Status: DC

## 2014-04-21 MED ORDER — LINACLOTIDE 145 MCG PO CAPS: 145.0000 ug | ORAL_CAPSULE | Freq: Every day | ORAL | Status: DC

## 2014-04-21 NOTE — Telephone Encounter (Signed)
Routing to the refill box. 

## 2014-04-21 NOTE — Telephone Encounter (Signed)
Pt called to let us know he tried Linzess and it works for him and would like a prescription called into [facility] and also, he lost the prescription for his antiacid and was asking if he could get a new rx for that as well.

## 2014-04-21 NOTE — Addendum Note (Signed)
Addended by: Mahala Menghini on: 04/21/2014 12:40 PM   Modules accepted: Orders, Medications

## 2014-04-21 NOTE — Telephone Encounter (Signed)
done

## 2014-05-10 ENCOUNTER — Encounter: Payer: Self-pay | Admitting: Nurse Practitioner

## 2014-05-10 ENCOUNTER — Ambulatory Visit (INDEPENDENT_AMBULATORY_CARE_PROVIDER_SITE_OTHER): Payer: BLUE CROSS/BLUE SHIELD | Admitting: Nurse Practitioner

## 2014-05-10 VITALS — BP 142/92 | HR 94 | Temp 97.5°F | Ht 69.0 in | Wt 300.0 lb

## 2014-05-10 DIAGNOSIS — K219 Gastro-esophageal reflux disease without esophagitis: Secondary | ICD-10-CM

## 2014-05-10 DIAGNOSIS — K5900 Constipation, unspecified: Secondary | ICD-10-CM

## 2014-05-10 NOTE — Patient Instructions (Signed)
1. Try adding a daily fiber supplement (like Benefiber or fiber gummies) 2. Return for follow-up in 6 months or sooner if you have a change in your symptoms

## 2014-05-10 NOTE — Progress Notes (Signed)
Referring Provider: No ref. provider found Primary Care Physician:  No PCP Per Patient Primary GI:  Dr. Gala Romney  Chief Complaint  Patient presents with  . Follow-up    HPI:   29 year old male presents for follow-up on abdominal pain and constipation. At last visit was advised Linzess 145 mcg daily and given samples. Called in to office saying it was working well and requested Rx for the medication. Had a colonoscopy and endoscopy 03/29/14; EGD found distal esophageal erosions consistent with mild erosive reflux esophagitis otherwise normal EGD, recommended begin Zegrid 40 mg daily; Colonoscopy showed internal hemorrhoids as likely source of paper hematochezia, diminutive rectal and sigmoid polyps x 2 total with pathology showing hyperplasia without adenomatous changes in both the sigmoid and rectal polyps, recommend 3 year follow-up.  Today states he's doing much better. No longer having abdominal pain. Has a bowel movement twice every morning. In the evening he at times feels constipated but with minimal to no abdominal pain. The pharmacy did not have the appropriate strength in stock but has ordered it. In the meantime he has been taking a hybrid dose. Is not currently having any persistent acid reflux disease. Denies esophogeal/epigastric pain/burning, excessive belching, bitter/sour taste in his mouth. Denies hematochezia or melena. Denies any other upper or lower GI symptoms.  Past Medical History  Diagnosis Date  . Chronic back pain   . Colon polyps 2015  . GERD (gastroesophageal reflux disease)     Past Surgical History  Procedure Laterality Date  . Knee surgery    . Cholecystectomy  12/26/13  . Colonoscopy  11/2013    Polypectomy x 4  . Esophagogastroduodenoscopy  11/2013  . Esophagogastroduodenoscopy N/A 03/29/2014    [id number] ESOPHAGEAL EROSION   . Flexible sigmoidoscopy N/A 03/29/2014    Procedure: FLEXIBLE SIGMOIDOSCOPY;  Surgeon: Daneil Dolin, MD;  Location: [facility];  Service: Endoscopy;  Laterality: N/A;    Current Outpatient Prescriptions  Medication Sig Dispense Refill  . Linaclotide (LINZESS) 145 MCG CAPS capsule Take 1 capsule (145 mcg total) by mouth daily. 30 capsule 11  . omeprazole-sodium bicarbonate (ZEGERID) 40-1100 MG per capsule Take 1 capsule by mouth daily before breakfast. 30 capsule 11  . hydrocortisone (ANUSOL-HC) 2.5 % rectal cream Place 1 application rectally 2 (two) times daily. Apply this twice a day for 7 days. Alternate the applications with applications of the other rectal cream (nitroglycerin) (Patient not taking: Reported on 05/10/2014) 30 g 0  . magnesium hydroxide (MILK OF MAGNESIA) 400 MG/5ML suspension Take 30 mLs by mouth 2 (two) times daily.     . Nitroglycerin 0.4 % OINT Place 1 application rectally 2 (two) times daily. Apply this twice a day for 3 weeks. Alternate the applications with applications of the other rectal cream (Anusol) 1 Tube 0  . oxyCODONE-acetaminophen (PERCOCET/ROXICET) 5-325 MG per tablet Take 1 tablet by mouth every 6 (six) hours as needed for severe pain. (Patient not taking: Reported on 03/14/2014) 15 tablet 0  . Simethicone (GAS-X MAXIMUM STRENGTH PO) Take 1 tablet by mouth daily as needed. Gas discomfort    . sucralfate (CARAFATE) 1 GM/10ML suspension Take 10 mLs (1 g total) by mouth 4 (four) times daily -  with meals and at bedtime. (Patient not taking: Reported on 03/14/2014) 420 mL 0   No current facility-administered medications for this visit.    Allergies as of 05/10/2014  . (No Known Allergies)    Family History  Problem Relation Age of  Onset  . Colon cancer Neg Hx     History   Social History  . Marital Status: Married    Spouse Name: N/A  . Number of Children: N/A  . 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 Smoking status: Former Smoker -- 1.00 packs/day    Types: Cigarettes    Quit date:  09/11/2013  . Smokeless tobacco: Never Used  . Alcohol Use: No     Comment: one a month  . Drug Use: No  . Sexual Activity: Not on file   Other Topics Concern  . None   Social History Narrative    Review of Systems: Gen: Denies fever, chills, anorexia. Denies unintentional weight loss.  CV: Denies chest pain, palpitations, syncope. Resp: Denies dyspnea at rest, wheezing, coughing up blood. GI: Denies vomiting blood, dysphagia or odynophagia. Derm: Denies rash, itching, dry skin Psych: Denies depression, anxiety, memory loss, confusion. Heme: Denies bruising, bleeding, and enlarged lymph nodes.  Physical Exam: BP 142/92 mmHg  Pulse 94  Temp(Src) 97.5 F (36.4 C) (Oral)  Ht 5\' 9"  (1.753 m)  Wt 300 lb (136.079 kg)  BMI 44.28 kg/m2 General:   Alert and oriented. No distress noted. Pleasant and cooperative.  Head:  Normocephalic and atraumatic. Eyes:  Conjuctiva clear without scleral icterus. Lungs:  Clear to auscultation bilaterally. No wheezes, rales, or rhonchi. No distress.  Heart:  S1, S2 present without murmurs, rubs, or gallops. Regular rate and rhythm. Abdomen:  +BS, soft, non-tender and non-distended. No rebound or guarding. No HSM or masses noted. Extremities:  Without edema. Neurologic:  Alert and  oriented x4;  grossly normal neurologically. Skin:  Intact without significant lesions or rashes. Psych:  Alert and cooperative. Normal mood and affect.    05/10/2014 10:12 AM

## 2014-05-11 NOTE — Assessment & Plan Note (Signed)
Symptoms well controlled on Zegrid, no further abdominal pain. Discussed importance of medication and appropriate diet as well as avoiding NSAIDs and ASA powders. Return for follow-up in 6 months.

## 2014-05-11 NOTE — Assessment & Plan Note (Signed)
Constipation and abdominal pain symptoms much improved on Linzess, is having two bowel movements every morning without diarrhea or other adverse effects. Is taking Zegrid as a hybrid dose due to lack of pharmacy stok, but it is on order for him and next refill should have the appropriate dosing. GERD symptoms well controlled on this regimen. Has some very minor discomfort later in the evening, recommend taking daily fiber supplementation for optimization of control such as OTC benefiber. Will follow-up in 6 months to re-evaluate patient progress, or sooner if needed. Repeat colonoscopy in 3 years.

## 2014-05-12 NOTE — Progress Notes (Signed)
No pcp per patient 

## 2014-10-03 ENCOUNTER — Encounter: Payer: Self-pay | Admitting: Internal Medicine

## 2014-10-17 ENCOUNTER — Telehealth: Payer: Self-pay | Admitting: Internal Medicine

## 2014-10-17 NOTE — Telephone Encounter (Signed)
Pt is having problems getting his medication (Zegrid) because of insurance and he has been without his medicine for a month and was asking if there was something else he could take or samples he could try. I transferred him to [facility]. Please advise. Pt is also on recall for September

## 2014-10-17 NOTE — Telephone Encounter (Signed)
Tried to call pt- call would not go thru. I just received a fax from [place] today to do a PA for zegerid. PA has been done and approved. Pt should be able to get medication now.

## 2014-10-18 NOTE — Telephone Encounter (Signed)
Tried to call pt- NA 

## 2014-10-18 NOTE — Telephone Encounter (Signed)
Pt is aware.  

## 2014-10-30 ENCOUNTER — Emergency Department (HOSPITAL_COMMUNITY)
Admission: EM | Admit: 2014-10-30 | Discharge: 2014-10-31 | Disposition: A | Discharge status: Home / Self Care | Payer: BLUE CROSS/BLUE SHIELD | Attending: Emergency Medicine | Admitting: Emergency Medicine

## 2014-10-30 ENCOUNTER — Telehealth: Payer: Self-pay

## 2014-10-30 ENCOUNTER — Encounter (HOSPITAL_COMMUNITY): Payer: Self-pay | Admitting: *Deleted

## 2014-10-30 ENCOUNTER — Emergency Department (HOSPITAL_COMMUNITY): Payer: BLUE CROSS/BLUE SHIELD

## 2014-10-30 DIAGNOSIS — G8929 Other chronic pain: Secondary | ICD-10-CM | POA: Insufficient documentation

## 2014-10-30 DIAGNOSIS — Z86010 Personal history of colonic polyps: Secondary | ICD-10-CM | POA: Diagnosis not present

## 2014-10-30 DIAGNOSIS — K219 Gastro-esophageal reflux disease without esophagitis: Secondary | ICD-10-CM | POA: Diagnosis not present

## 2014-10-30 DIAGNOSIS — Z79899 Other long term (current) drug therapy: Secondary | ICD-10-CM | POA: Insufficient documentation

## 2014-10-30 DIAGNOSIS — Z720 Tobacco use: Secondary | ICD-10-CM | POA: Insufficient documentation

## 2014-10-30 DIAGNOSIS — R079 Chest pain, unspecified: Secondary | ICD-10-CM | POA: Diagnosis present

## 2014-10-30 HISTORY — DX: Irritable bowel syndrome, unspecified: K58.9

## 2014-10-30 LAB — I-STAT CHEM 8, ED
BUN: 14 mg/dL (ref 6–20)
CALCIUM ION: 1.12 mmol/L (ref 1.12–1.23)
CREATININE: 0.9 mg/dL (ref 0.61–1.24)
Chloride: 106 mmol/L (ref 101–111)
Glucose, Bld: 118 mg/dL — ABNORMAL HIGH (ref 65–99)
HCT: 46 % (ref 39.0–52.0)
Hemoglobin: 15.6 g/dL (ref 13.0–17.0)
Potassium: 3.5 mmol/L (ref 3.5–5.1)
Sodium: 144 mmol/L (ref 135–145)
TCO2: 23 mmol/L (ref 0–100)

## 2014-10-30 LAB — I-STAT TROPONIN, ED: Troponin i, poc: 0 ng/mL (ref 0.00–0.08)

## 2014-10-30 MED ORDER — FAMOTIDINE 20 MG PO TABS
40.0000 mg | ORAL_TABLET | Freq: Once | ORAL | Status: AC
  Administered 2014-10-30: 40 mg via ORAL
  Filled 2014-10-30: qty 2

## 2014-10-30 MED ORDER — GI COCKTAIL ~~LOC~~: 30.0000 mL | Freq: Once | ORAL | Status: DC

## 2014-10-30 MED ORDER — RANITIDINE HCL 150 MG/10ML PO SYRP
150.0000 mg | ORAL_SOLUTION | Freq: Two times a day (BID) | ORAL | Status: DC
Start: 2014-10-30 — End: 2015-03-15

## 2014-10-30 MED ORDER — HYDROMORPHONE HCL 1 MG/ML IJ SOLN
1.0000 mg | Freq: Once | INTRAMUSCULAR | Status: AC
  Administered 2014-10-30: 1 mg via INTRAMUSCULAR
  Filled 2014-10-30: qty 1

## 2014-10-30 MED ORDER — SUCRALFATE 1 GM/10ML PO SUSP: 1.0000 g | Freq: Three times a day (TID) | ORAL | Status: DC

## 2014-10-30 MED ORDER — GI COCKTAIL ~~LOC~~
30.0000 mL | Freq: Once | ORAL | Status: AC
  Administered 2014-10-30: 30 mL via ORAL
  Filled 2014-10-30: qty 30

## 2014-10-30 MED ORDER — ONDANSETRON 8 MG PO TBDP
8.0000 mg | ORAL_TABLET | Freq: Once | ORAL | Status: AC
  Administered 2014-10-30: 8 mg via ORAL
  Filled 2014-10-30: qty 1

## 2014-10-30 NOTE — Discharge Instructions (Signed)
Gastroesophageal Reflux Disease, Adult °Gastroesophageal reflux disease (GERD) happens when acid from your stomach goes into your food pipe (esophagus). The acid can cause a burning feeling in your chest. Over time, the acid can make small holes (ulcers) in your food pipe.  °HOME CARE °· Ask your doctor for advice about: °¨ Losing weight. °¨ Quitting smoking. °¨ Alcohol use. °· Avoid foods and drinks that make your problems worse. You may want to avoid: °¨ Caffeine and alcohol. °¨ Chocolate. °¨ Mints. °¨ Garlic and onions. °¨ Spicy foods. °¨ Citrus fruits, such as oranges, lemons, or limes. °¨ Foods that contain tomato, such as sauce, chili, salsa, and pizza. °¨ Fried and fatty foods. °· Avoid lying down for 3 hours before you go to bed or before you take a nap. °· Eat small meals often, instead of large meals. °· Wear loose-fitting clothing. Do not wear anything tight around your waist. °· Raise (elevate) the head of your bed 6 to 8 inches with wood blocks. Using extra pillows does not help. °· Only take medicines as told by your doctor. °· Do not take aspirin or ibuprofen. °GET HELP RIGHT AWAY IF:  °· You have pain in your arms, neck, jaw, teeth, or back. °· Your pain gets worse or changes. °· You feel sick to your stomach (nauseous), throw up (vomit), or sweat (diaphoresis). °· You feel short of breath, or you pass out (faint). °· Your throw up is green, yellow, black, or looks like coffee grounds or blood. °· Your poop (stool) is red, bloody, or black. °MAKE SURE YOU:  °· Understand these instructions. °· Will watch your condition. °· Will get help right away if you are not doing well or get worse. °Document Released: 08/06/2007 Document Revised: 05/12/2011 Document Reviewed: 09/06/2010 °ExitCare® Patient Information ©2015 [employer]. This information is not intended to replace advice given to you by your health care provider. Make sure you discuss any questions you have with your health care provider. ° °

## 2014-10-30 NOTE — ED Notes (Signed)
Pt c/o mid chest pain that woke him up from sleeping last night and has not gotten any better; pt states the pain is described as a heaviness and states he has been dx with acid reflux in the past

## 2014-10-30 NOTE — Telephone Encounter (Signed)
Pt called- he had been out of his zegerid for a month. He just started retaking it last week. Friday night he woke up in the middle of the night choking on reflux. He said it wakes him up in the middle of the night. He has only tried prevacid in the past. He said zegerid has been working well until Friday. Erline Levine has made him a follow up appt on 11/20/14.   Pt wants to know if there is anything he can do until his ov?

## 2014-10-30 NOTE — ED Provider Notes (Signed)
CSN: [id number]     Arrival date & time 10/30/14  2033 History   First MD Initiated Contact with Patient 10/30/14 2050     Chief Complaint  Patient presents with  . Chest Pain     (Consider location/radiation/quality/duration/timing/severity/associated sxs/prior Treatment) HPI Comments: Patient presents to the ER for evaluation of 4 days worth of progressively worsening chest discomfort. Patient reports that he feels like the center of his chest is filled with fluid and he has had increased belching. There is no shortness of breath. He has been diagnosed with GERD. No history of heart disease.  Patient is a 29 y.o. male presenting with chest pain.  Chest Pain   Past Medical History  Diagnosis Date  . Chronic back pain   . Colon polyps 2015  . GERD (gastroesophageal reflux disease)   . IBS (irritable bowel syndrome)    Past Surgical History  Procedure Laterality Date  . Knee surgery    . Cholecystectomy  12/26/13  . Colonoscopy  11/2013    Polypectomy x 4  . Esophagogastroduodenoscopy  11/2013  . Esophagogastroduodenoscopy N/A 03/29/2014    [id number] ESOPHAGEAL EROSION   . Flexible sigmoidoscopy N/A 03/29/2014    Procedure: FLEXIBLE SIGMOIDOSCOPY;  Surgeon: Daneil Dolin, MD;  Location: [facility];  Service: Endoscopy;  Laterality: N/A;   Family History  Problem Relation Age of Onset  . Colon cancer Neg Hx    Social History  Substance Use Topics  . Smoking status: Current Every Day Smoker -- 1.00 packs/day    Types: Cigarettes    Last Attempt to Quit: 09/11/2013  . Smokeless tobacco: Never Used  . Alcohol Use: No     Comment: one a month    Review of Systems  Cardiovascular: Positive for chest pain.  All other systems reviewed and are negative.     Allergies  Review of patient's allergies indicates no known allergies.  Home Medications   Prior to Admission medications   Medication Sig Start Date End Date Taking? Authorizing Provider  Linaclotide  (LINZESS) 145 MCG CAPS capsule Take 1 capsule (145 mcg total) by mouth daily. 04/21/14  Yes Mahala Menghini, PA-C  magnesium hydroxide (MILK OF MAGNESIA) 400 MG/5ML suspension Take 30 mLs by mouth daily as needed for heartburn or indigestion.    Yes Historical Provider, MD  omeprazole-sodium bicarbonate (ZEGERID) 40-1100 MG per capsule Take 1 capsule by mouth daily before breakfast. 04/21/14  Yes Mahala Menghini, PA-C   BP 148/88 mmHg  Pulse 78  Temp(Src) 98.2 F (36.8 C) (Oral)  Resp 24  Ht 5\' 9"  (1.753 m)  Wt 326 lb (147.873 kg)  BMI 48.12 kg/m2  SpO2 99% Physical Exam  Constitutional: He is oriented to person, place, and time. He appears well-developed and well-nourished. No distress.  HENT:  Head: Normocephalic and atraumatic.  Right Ear: Hearing normal.  Left Ear: Hearing normal.  Nose: Nose normal.  Mouth/Throat: Oropharynx is clear and moist and mucous membranes are normal.  Eyes: Conjunctivae and EOM are normal. Pupils are equal, round, and reactive to light.  Neck: Normal range of motion. Neck supple.  Cardiovascular: Regular rhythm, S1 normal and S2 normal.  Exam reveals no gallop and no friction rub.   No murmur heard. Pulmonary/Chest: Effort normal and breath sounds normal. No respiratory distress. He exhibits no tenderness.  Abdominal: Soft. Normal appearance and bowel sounds are normal. There is no hepatosplenomegaly. There is no tenderness. There is no rebound, no guarding, no tenderness at McBurney's  point and negative Murphy's sign. No hernia.  Musculoskeletal: Normal range of motion.  Neurological: He is alert and oriented to person, place, and time. He has normal strength. No cranial nerve deficit or sensory deficit. Coordination normal. GCS eye subscore is 4. GCS verbal subscore is 5. GCS motor subscore is 6.  Skin: Skin is warm, dry and intact. No rash noted. No cyanosis.  Psychiatric: He has a normal mood and affect. His speech is normal and behavior is normal. Thought  content normal.  Nursing note and vitals reviewed.   ED Course  Procedures (including critical care time) Labs Review Labs Reviewed  I-STAT CHEM 8, ED  I-STAT TROPOININ, ED    Imaging Review No results found. I have personally reviewed and evaluated these images and lab results as part of my medical decision-making.   EKG Interpretation   Date/Time:  Monday October 30 2014 20:47:37 EDT Ventricular Rate:  82 PR Interval:  150 QRS Duration: 104 QT Interval:  362 QTC Calculation: 422 R Axis:   83 Text Interpretation:  Normal sinus rhythm Normal ECG Confirmed by POLLINA   MD, CHRISTOPHER [id number]) on 10/30/2014 8:57:00 PM      MDM   Final diagnoses:  None  GERD  Patient presents to the ER for evaluation of chest pain. Patient has atypical pain. Pain is been present for 4 days continuously, progressively worsening. He does have a history of severe GERD, treated by Dr. Gala Romney. He has not had any hematemesis or melanotic. Cardiac evaluation unremarkable. He does not require serial enzymes, as he has had symptoms for 4 days continuously. Patient will be treated aggressively for reflux and possible esophageal spasm.    Orpah Greek, MD 10/30/14 2059

## 2014-11-01 ENCOUNTER — Other Ambulatory Visit: Payer: Self-pay | Admitting: Nurse Practitioner

## 2014-11-01 MED ORDER — SUCRALFATE 1 GM/10ML PO SUSP: 1.0000 g | Freq: Three times a day (TID) | ORAL | Status: DC

## 2014-11-01 NOTE — Telephone Encounter (Signed)
I will send in Carafate to his pharmacy to help until his PPI becomes effective again.

## 2014-11-01 NOTE — Telephone Encounter (Signed)
Pt is aware.  

## 2014-11-20 ENCOUNTER — Ambulatory Visit: Payer: BLUE CROSS/BLUE SHIELD | Admitting: Gastroenterology

## 2014-11-27 NOTE — Telephone Encounter (Signed)
Pt is calling because he is still having the choking feeling and waking up in the middle of the night . The Zegerid is not working. Please advise

## 2014-11-27 NOTE — Telephone Encounter (Signed)
See pcp regarding skin burning and low back pain

## 2014-11-27 NOTE — Telephone Encounter (Signed)
I spoke with Vaughan Basta at [facility] and she stated the patient has a $0 copayment for his Zegerid this month.  They can fill half of it today and the other half of the Rx will be filled tomorrow morning.  I made the patient aware.

## 2014-11-27 NOTE — Telephone Encounter (Signed)
Patient is scheduled to see RMR in the morning at 830.  Routing to RMR for further recommendations.

## 2014-11-27 NOTE — Telephone Encounter (Signed)
Patient called in stating he took the Carafate and the Omeprazole is not working at all.  He is waking up with a choking like feeling every night.  Is there any way he could start back on his Zegerid?   He was told by [facility] that a PA needs to be done prior to them filling it, however we show in our records that a PA was done back in 10/2014

## 2014-11-27 NOTE — Telephone Encounter (Signed)
Patient called in stating he feels like his skin is burning and is having lower back pain.

## 2014-11-28 ENCOUNTER — Encounter: Payer: Self-pay | Admitting: Internal Medicine

## 2014-11-28 ENCOUNTER — Ambulatory Visit (INDEPENDENT_AMBULATORY_CARE_PROVIDER_SITE_OTHER): Payer: BLUE CROSS/BLUE SHIELD | Admitting: Internal Medicine

## 2014-11-28 VITALS — BP 167/103 | HR 82 | Temp 97.7°F | Ht 69.0 in | Wt 312.2 lb

## 2014-11-28 DIAGNOSIS — K219 Gastro-esophageal reflux disease without esophagitis: Secondary | ICD-10-CM

## 2014-11-28 NOTE — Patient Instructions (Signed)
Trial of Dexilant 60 mg daily - samples provided  Loose weight as discussed (20 pounds by February 2017)  GERD information  Continue Linzess daily  We will order a sleep study  Find a primary care doctor  Office visit in 6 weeks

## 2014-11-28 NOTE — Progress Notes (Signed)
Primary Care Physician:  No PCP Per Patient Primary Gastroenterologist:  Dr. Marlynn Perking    HPI:  Antonio Griffith is a 29 y.o. male here for followup GERD and constipation. Patient gained 26 pounds this year since his office visit earlier in the year;   he has dropped back from a half a case of 12 oz cans of sodas daily  to one or 2. Was 312 pounds today. Ran out of Zegerid last month; Went to the ED with a flare of reflux symptoms. He sleep upright in a chair. Patient states he snores loudly; doesn't get any good sleep;  tosses and turns all my long. No prior sleep study. No PCP. Constipation well managed withlLinzess145 but he ran out of that medication as well. He just got some Zegerid through the mail and  has not started back on it as of yet. Prior EGD demonstrated mild erosive reflux esophagitis. No Barrett's. Colonoscopy/flexible sigmoidoscopy recently negative for any significant pathology. He is trying to get more exercise; he walks in the local parks on regular basis.  Also, describes lactose intolerance.  Works as a Dealer for [employer]  Past Medical History  Diagnosis Date  . Chronic back pain   . Colon polyps 2015  . GERD (gastroesophageal reflux disease)   . IBS (irritable bowel syndrome)   . Hyperplastic colon polyp     Past Surgical History  Procedure Laterality Date  . Knee surgery    . Cholecystectomy  12/26/13  . Colonoscopy  11/2013    Polypectomy x 4  . Esophagogastroduodenoscopy  11/2013  . Esophagogastroduodenoscopy N/A 03/29/2014    [id number] ESOPHAGEAL EROSION   . Flexible sigmoidoscopy N/A 03/29/2014    Dr.Rourk- Anal canal internal hemorrhoids, likely source of paper hematochezia. Diminutive rectal and sigmoid polyps were removed bx= hyperplastic polyps    Prior to Admission medications   Medication Sig Start Date End Date Taking? Authorizing Provider  Linaclotide (LINZESS) 145 MCG CAPS capsule Take 1 capsule (145 mcg total) by mouth daily. 04/21/14  Yes  Mahala Menghini, PA-C  magnesium hydroxide (MILK OF MAGNESIA) 400 MG/5ML suspension Take 30 mLs by mouth daily as needed for heartburn or indigestion.     Historical Provider, MD  omeprazole-sodium bicarbonate (ZEGERID) 40-1100 MG per capsule Take 1 capsule by mouth daily before breakfast. Patient not taking: Reported on 11/28/2014 04/21/14   Mahala Menghini, PA-C  ranitidine (ZANTAC) 150 MG/10ML syrup Take 10 mLs (150 mg total) by mouth 2 (two) times daily. Patient not taking: Reported on 11/28/2014 10/30/14   Orpah Greek, MD  sucralfate (CARAFATE) 1 GM/10ML suspension Take 10 mLs (1 g total) by mouth 4 (four) times daily -  with meals and at bedtime. Patient not taking: Reported on 11/28/2014 11/01/14   Carlis Stable, NP    Allergies as of 11/28/2014 - Review Complete 11/28/2014  Allergen Reaction Noted  . Peanut-containing drug products Hives 11/28/2014    Family History  Problem Relation Age of Onset  . Colon cancer Neg Hx     Social History   Social History  . Marital Status: Married    Spouse Name: N/A  . Number of Children: N/A  . 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 Smoking status: Current Every Day Smoker -- 1.00 packs/day    Types: Cigarettes    Last Attempt to Quit: 09/11/2013  .  Smokeless tobacco: Never Used  . Alcohol Use: No     Comment: one a month  . Drug Use: No  . Sexual Activity: Not on file   Other Topics Concern  . Not on file   Social History Narrative    Review of Systems: See HPI, otherwise negative ROS  Physical Exam: BP 167/103 mmHg  Pulse 82  Temp(Src) 97.7 F (36.5 C)  Ht 5\' 9"  (1.753 m)  Wt 312 lb 3.2 oz (141.613 kg)  BMI 46.08 kg/m2 General:   Significantly obese. pleasant and cooperative in NAD. Skin:  Intact without significant lesions or rashes.. Multiple tattoos. Eyes:  Sclera clear, no icterus.   Conjunctiva pink. Neck:  Supple; no masses or  thyromegaly. No significant cervical adenopathy. Lungs:  Clear throughout to auscultation.   No wheezes, crackles, or rhonchi. No acute distress. Heart:  Regular rate and rhythm; no murmurs, clicks, rubs,  or gallops. Abdomen: obese. Laparoscopy sites well-healed.Non-distended, normal bowel sounds.  Soft and nontender without appreciable mass or hepatosplenomegaly.  Pulses:  Normal pulses noted. Extremities:  Without clubbing or edema.  Impression:  Pleasant 29 year old obese gentleman with GERD with recent flare in the setting of obesity and weight gain -off of acid suppression therapy recently as well. Constipation well managed  He likely has sleep apnea-undiagnosed.  I discussed the multipronged approach regarding GERD with the patient. He needs a primary care doctor  Recommendations:  Trial of Dexilant 60 mg daily - samples provided  Loose weight as discussed (20 pounds by February 2017)  GERD information  Continue Linzess daily  We will order a sleep study  Find a primary care doctor  Office visit in 6 weeks    Notice: This dictation was prepared with Dragon dictation along with smaller phrase technology. Any transcriptional errors that result from this process are unintentional and may not be corrected upon review.

## 2014-11-28 NOTE — Telephone Encounter (Signed)
Pt saw RMR today.

## 2014-12-01 ENCOUNTER — Ambulatory Visit: Payer: BLUE CROSS/BLUE SHIELD | Admitting: Internal Medicine

## 2014-12-05 ENCOUNTER — Ambulatory Visit: Payer: BLUE CROSS/BLUE SHIELD | Admitting: Nurse Practitioner

## 2014-12-06 ENCOUNTER — Encounter: Payer: Self-pay | Admitting: Internal Medicine

## 2014-12-18 ENCOUNTER — Other Ambulatory Visit: Payer: Self-pay

## 2014-12-18 NOTE — Telephone Encounter (Signed)
Pt called and states that the dexilant is working for him. Wants to have a RX sent in to his pharmacy

## 2014-12-19 MED ORDER — DEXLANSOPRAZOLE 60 MG PO CPDR: 60.0000 mg | DELAYED_RELEASE_CAPSULE | Freq: Every day | ORAL | Status: DC

## 2015-01-01 ENCOUNTER — Other Ambulatory Visit: Payer: Self-pay

## 2015-01-01 ENCOUNTER — Telehealth: Payer: Self-pay

## 2015-01-01 DIAGNOSIS — Z139 Encounter for screening, unspecified: Secondary | ICD-10-CM

## 2015-01-01 NOTE — Telephone Encounter (Signed)
Clinical notes sent to provider

## 2015-01-01 NOTE — Telephone Encounter (Signed)
Pt was scheduled for an office visit tomorrow with RMR. Dr.Rourk had wanted pt to have a sleep study done. This has not been scheduled yet. Antonio Griffith called Dr.Doonquah's office and they got him scheduled for 01/09/15 at 8:45am. She also rescheduled his ov here to 01/30/15 at 9:30am. We cancelled his ov here tomorrow. Pt is aware of all of the changes and new appointments.   Please send referral paperwork for the pt. Thanks.

## 2015-01-02 ENCOUNTER — Ambulatory Visit: Payer: BLUE CROSS/BLUE SHIELD | Admitting: Internal Medicine

## 2015-01-12 ENCOUNTER — Ambulatory Visit: Payer: BLUE CROSS/BLUE SHIELD | Admitting: Internal Medicine

## 2015-01-23 ENCOUNTER — Other Ambulatory Visit (HOSPITAL_COMMUNITY): Payer: Self-pay | Admitting: Respiratory Therapy

## 2015-01-23 DIAGNOSIS — F5109 Other insomnia not due to a substance or known physiological condition: Secondary | ICD-10-CM

## 2015-01-23 DIAGNOSIS — K219 Gastro-esophageal reflux disease without esophagitis: Secondary | ICD-10-CM

## 2015-01-23 DIAGNOSIS — R5383 Other fatigue: Secondary | ICD-10-CM

## 2015-01-23 DIAGNOSIS — G4733 Obstructive sleep apnea (adult) (pediatric): Secondary | ICD-10-CM

## 2015-01-23 DIAGNOSIS — G4452 New daily persistent headache (NDPH): Secondary | ICD-10-CM

## 2015-01-29 ENCOUNTER — Telehealth: Payer: Self-pay

## 2015-01-29 MED ORDER — LINACLOTIDE 290 MCG PO CAPS: 290.0000 ug | ORAL_CAPSULE | Freq: Every day | ORAL | Status: DC

## 2015-01-29 NOTE — Telephone Encounter (Signed)
Pt has ov with RMR tomorrow. He has been to see Dr.Doonquah but has not had sleep study done yet. They have him scheduled for 02/09/15 to have this done.  Pt is still having some problems with constipation. He is taking linzess 170mcg. He said he has the urge to have a bm and when he goes to the bathroom, nothing comes out. It has been several days since his last bm.   1-Pt is wanting to know if he can increase the linzess? 2- AND he wants to know if he needs to keep his ov tomorrow or do you want him to reschedule until after the sleep study?

## 2015-01-29 NOTE — Telephone Encounter (Signed)
Pt is aware. rx sent in 

## 2015-01-29 NOTE — Telephone Encounter (Signed)
He can increase Linzess to 290 daily (disp 30 w 11 refills).  Can keep office appt w [place] tomorrow irrespective of sleep study

## 2015-01-30 ENCOUNTER — Encounter: Payer: Self-pay | Admitting: Internal Medicine

## 2015-01-30 ENCOUNTER — Ambulatory Visit (INDEPENDENT_AMBULATORY_CARE_PROVIDER_SITE_OTHER): Payer: BLUE CROSS/BLUE SHIELD | Admitting: Internal Medicine

## 2015-01-30 VITALS — BP 155/101 | HR 87 | Temp 98.2°F | Ht 69.0 in | Wt 312.8 lb

## 2015-01-30 DIAGNOSIS — K5900 Constipation, unspecified: Secondary | ICD-10-CM | POA: Diagnosis not present

## 2015-01-30 DIAGNOSIS — K219 Gastro-esophageal reflux disease without esophagitis: Secondary | ICD-10-CM

## 2015-01-30 NOTE — Progress Notes (Signed)
Primary Care Physician:  Phillips Odor, MD Primary Gastroenterologist:  Dr. Gala Romney  Pre-Procedure History & Physical: HPI:  Antonio Griffith is a 29 y.o. male here for followup of GERD and constipation. Lens S1 45 help with constipation feels he needs a higher dose. Dexilant 60 mg daily controlling reflux symptoms very well. He has had a difficult time accomplishing any weight loss. He is set up for a sleep study in the near future.  Past Medical History  Diagnosis Date  . Chronic back pain   . Colon polyps 2015  . GERD (gastroesophageal reflux disease)   . IBS (irritable bowel syndrome)   . Hyperplastic colon polyp     Past Surgical History  Procedure Laterality Date  . Knee surgery    . Cholecystectomy  12/26/13  . Colonoscopy  11/2013    Polypectomy x 4  . Esophagogastroduodenoscopy  11/2013  . Esophagogastroduodenoscopy N/A 03/29/2014    [id number] ESOPHAGEAL EROSION   . Flexible sigmoidoscopy N/A 03/29/2014    Dr.Santanna Whitford- Anal canal internal hemorrhoids, likely source of paper hematochezia. Diminutive rectal and sigmoid polyps were removed bx= hyperplastic polyps    Prior to Admission medications   Medication Sig Start Date End Date Taking? Authorizing Provider  dexlansoprazole (DEXILANT) 60 MG capsule Take 1 capsule (60 mg total) by mouth daily. 12/19/14  Yes Mahala Menghini, PA-C  Linaclotide (LINZESS) 145 MCG CAPS capsule Take 1 capsule (145 mcg total) by mouth daily. Patient not taking: Reported on 01/30/2015 04/21/14   Mahala Menghini, PA-C  Linaclotide [facility]) 290 MCG CAPS capsule Take 1 capsule (290 mcg total) by mouth daily. Patient not taking: Reported on 01/30/2015 01/29/15   Daneil Dolin, MD  magnesium hydroxide (MILK OF MAGNESIA) 400 MG/5ML suspension Take 30 mLs by mouth daily as needed for heartburn or indigestion.     Historical Provider, MD  omeprazole-sodium bicarbonate (ZEGERID) 40-1100 MG per capsule Take 1 capsule by mouth daily before breakfast. Patient  not taking: Reported on 11/28/2014 04/21/14   Mahala Menghini, PA-C  ranitidine (ZANTAC) 150 MG/10ML syrup Take 10 mLs (150 mg total) by mouth 2 (two) times daily. Patient not taking: Reported on 01/30/2015 10/30/14   Orpah Greek, MD  sucralfate (CARAFATE) 1 GM/10ML suspension Take 10 mLs (1 g total) by mouth 4 (four) times daily -  with meals and at bedtime. Patient not taking: Reported on 01/30/2015 11/01/14   Carlis Stable, NP    Allergies as of 01/30/2015 - Review Complete 01/30/2015  Allergen Reaction Noted  . Peanut-containing drug products Hives 11/28/2014    Family History  Problem Relation Age of Onset  . Colon cancer Neg Hx     Social History   Social History  . Marital Status: Married    Spouse Name: N/A  . Number of Children: N/A  . 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 Smoking status: Current Every Day Smoker -- 1.00 packs/day    Types: Cigarettes    Last Attempt to Quit: 09/11/2013  . Smokeless tobacco: Never Used  . Alcohol Use: No     Comment: one a month  . Drug Use: No  . Sexual Activity: Not on file   Other Topics Concern  . Not on file   Social History Narrative    Review of Systems: See HPI, otherwise negative ROS  Physical Exam: BP 155/101 mmHg  Pulse 87  Temp(Src) 98.2 F (36.8 C) (Oral)  Ht 5\' 9"  (1.753 m)  Wt 312 lb 12.8 oz (141.885 kg)  BMI 46.17 kg/m2 General:   Alert,  Well-developed, well-nourished, pleasant and cooperative in NAD Skin:  Intact without significant lesions or rashes. Neck:  Supple; no masses or thyromegaly. No significant cervical adenopathy. Lungs:  Clear throughout to auscultation.   No wheezes, crackles, or rhonchi. No acute distress. Heart:  Regular rate and rhythm; no murmurs, clicks, rubs,  or gallops. Abdomen: Non-distended, normal bowel sounds.  Soft and nontender without appreciable mass or hepatosplenomegaly.  Pulses:   Normal pulses noted. Extremities:  Without clubbing or edema.  Impression:  29 year old morbidly obese gentleman with GERD and constipation. Dexilant controlling GERD symptoms well at this time. Linzess 145 has  helped her constipation but he likely would benefit from a higher dose. We've recently called a prescription in for the 290 mcg capsule. He has not yet picked it up. He has requested samples until he gets paid at the end of the week.   Recommendations:   Linzess 290 mcg daily. If patient develops diarrhea, let me know. Continue Dexilant 60 mg daily. Discussed the multipronged approach to reflux. He should try to lose 25 pounds in the next one year. We'll see him back in the office in 6 months to reassess.`       Notice: This dictation was prepared with Dragon dictation along with smaller phrase technology. Any transcriptional errors that result from this process are unintentional and may not be corrected upon review.

## 2015-01-30 NOTE — Patient Instructions (Signed)
Continue Linzess 290 daily  Continue Dexilant 60 mg daily  Office visit in 6 months

## 2015-02-20 ENCOUNTER — Telehealth: Payer: Self-pay | Admitting: Internal Medicine

## 2015-02-20 NOTE — Telephone Encounter (Signed)
Agree  Thank you

## 2015-02-20 NOTE — Telephone Encounter (Signed)
[place] THAT HE IS VERY NAUSEUS  PLEASE CALL HIM [phone]

## 2015-02-20 NOTE — Telephone Encounter (Signed)
Pt called, he ran out of dexilant yesterday. 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 He was having some nausea this morning and he feels like it was because he took some leftover prevacid that he had. He wanted to know if he could have some dexilant samples to last until he can get his new insurance straightened out in January. Left #5 boxes at the front desk.

## 2015-03-12 ENCOUNTER — Ambulatory Visit: Payer: BLUE CROSS/BLUE SHIELD | Attending: Neurology | Admitting: Sleep Medicine

## 2015-03-12 DIAGNOSIS — K219 Gastro-esophageal reflux disease without esophagitis: Secondary | ICD-10-CM

## 2015-03-12 DIAGNOSIS — R5383 Other fatigue: Secondary | ICD-10-CM

## 2015-03-12 DIAGNOSIS — G4709 Other insomnia: Secondary | ICD-10-CM | POA: Insufficient documentation

## 2015-03-12 DIAGNOSIS — G4733 Obstructive sleep apnea (adult) (pediatric): Secondary | ICD-10-CM | POA: Insufficient documentation

## 2015-03-12 DIAGNOSIS — R51 Headache: Secondary | ICD-10-CM | POA: Insufficient documentation

## 2015-03-12 DIAGNOSIS — G4452 New daily persistent headache (NDPH): Secondary | ICD-10-CM

## 2015-03-12 DIAGNOSIS — F5109 Other insomnia not due to a substance or known physiological condition: Secondary | ICD-10-CM

## 2015-03-15 ENCOUNTER — Encounter (HOSPITAL_COMMUNITY): Payer: Self-pay | Admitting: *Deleted

## 2015-03-15 ENCOUNTER — Emergency Department (HOSPITAL_COMMUNITY)
Admission: EM | Admit: 2015-03-15 | Discharge: 2015-03-15 | Disposition: A | Discharge status: Home / Self Care | Payer: BLUE CROSS/BLUE SHIELD | Attending: Emergency Medicine | Admitting: Emergency Medicine

## 2015-03-15 DIAGNOSIS — Z79899 Other long term (current) drug therapy: Secondary | ICD-10-CM | POA: Insufficient documentation

## 2015-03-15 DIAGNOSIS — Z86010 Personal history of colonic polyps: Secondary | ICD-10-CM | POA: Insufficient documentation

## 2015-03-15 DIAGNOSIS — K589 Irritable bowel syndrome without diarrhea: Secondary | ICD-10-CM | POA: Insufficient documentation

## 2015-03-15 DIAGNOSIS — G8929 Other chronic pain: Secondary | ICD-10-CM | POA: Insufficient documentation

## 2015-03-15 DIAGNOSIS — J0100 Acute maxillary sinusitis, unspecified: Secondary | ICD-10-CM | POA: Insufficient documentation

## 2015-03-15 DIAGNOSIS — K0889 Other specified disorders of teeth and supporting structures: Secondary | ICD-10-CM | POA: Insufficient documentation

## 2015-03-15 DIAGNOSIS — K219 Gastro-esophageal reflux disease without esophagitis: Secondary | ICD-10-CM | POA: Insufficient documentation

## 2015-03-15 DIAGNOSIS — F17210 Nicotine dependence, cigarettes, uncomplicated: Secondary | ICD-10-CM | POA: Insufficient documentation

## 2015-03-15 MED ORDER — AMOXICILLIN-POT CLAVULANATE 875-125 MG PO TABS: 1.0000 | ORAL_TABLET | Freq: Two times a day (BID) | ORAL | Status: DC

## 2015-03-15 MED ORDER — AMOXICILLIN-POT CLAVULANATE 875-125 MG PO TABS
1.0000 | ORAL_TABLET | Freq: Two times a day (BID) | ORAL | Status: DC
  Administered 2015-03-15: 1 via ORAL
  Filled 2015-03-15: qty 1

## 2015-03-15 NOTE — ED Provider Notes (Signed)
CSN: [id number]     Arrival date & time 03/15/15  1657 History   First MD Initiated Contact with Patient 03/15/15 1718     No chief complaint on file.     HPI 1 week of sinus pressure right greater than left in the maxillary sinus region. Reports right sided upper dental pain. Pain is moderate in severity. Fullness to right ear. Reports productive cough and congestion as well. No other symptoms. Son with similar symptoms 1-2 weeks ago   Past Medical History  Diagnosis Date  . Chronic back pain   . Colon polyps 2015  . GERD (gastroesophageal reflux disease)   . IBS (irritable bowel syndrome)   . Hyperplastic colon polyp    Past Surgical History  Procedure Laterality Date  . Knee surgery    . Cholecystectomy  12/26/13  . Colonoscopy  11/2013    Polypectomy x 4  . Esophagogastroduodenoscopy  11/2013  . Esophagogastroduodenoscopy N/A 03/29/2014    [id number] ESOPHAGEAL EROSION   . Flexible sigmoidoscopy N/A 03/29/2014    Dr.Rourk- Anal canal internal hemorrhoids, likely source of paper hematochezia. Diminutive rectal and sigmoid polyps were removed bx= hyperplastic polyps   Family History  Problem Relation Age of Onset  . Colon cancer Neg Hx    Social History  Substance Use Topics  . Smoking status: Current Every Day Smoker -- 1.00 packs/day    Types: Cigarettes    Last Attempt to Quit: 09/11/2013  . Smokeless tobacco: Never Used  . Alcohol Use: No     Comment: one a month    Review of Systems  All other systems reviewed and are negative.     Allergies  Peanut-containing drug products  Home Medications   Prior to Admission medications   Medication Sig Start Date End Date Taking? Authorizing Provider  amoxicillin-clavulanate (AUGMENTIN) 875-125 MG tablet Take 1 tablet by mouth every 12 (twelve) hours. 03/15/15   Jola Schmidt, MD  dexlansoprazole (DEXILANT) 60 MG capsule Take 1 capsule (60 mg total) by mouth daily. 12/19/14   Mahala Menghini, PA-C  Linaclotide  (LINZESS) 145 MCG CAPS capsule Take 1 capsule (145 mcg total) by mouth daily. Patient not taking: Reported on 01/30/2015 04/21/14   Mahala Menghini, PA-C  Linaclotide [facility]) 290 MCG CAPS capsule Take 1 capsule (290 mcg total) by mouth daily. Patient not taking: Reported on 01/30/2015 01/29/15   Daneil Dolin, MD  magnesium hydroxide (MILK OF MAGNESIA) 400 MG/5ML suspension Take 30 mLs by mouth daily as needed for heartburn or indigestion.     Historical Provider, MD  omeprazole-sodium bicarbonate (ZEGERID) 40-1100 MG per capsule Take 1 capsule by mouth daily before breakfast. Patient not taking: Reported on 11/28/2014 04/21/14   Mahala Menghini, PA-C  ranitidine (ZANTAC) 150 MG/10ML syrup Take 10 mLs (150 mg total) by mouth 2 (two) times daily. Patient not taking: Reported on 01/30/2015 10/30/14   Orpah Greek, MD  sucralfate (CARAFATE) 1 GM/10ML suspension Take 10 mLs (1 g total) by mouth 4 (four) times daily -  with meals and at bedtime. Patient not taking: Reported on 01/30/2015 11/01/14   Carlis Stable, NP   BP 164/96 mmHg  Pulse 87  Temp(Src) 98.1 F (36.7 C) (Tympanic)  Resp 20  Ht 5\' 9"  (1.753 m)  Wt 300 lb (136.079 kg)  BMI 44.28 kg/m2  SpO2 99% Physical Exam  Constitutional: He is oriented to person, place, and time. He appears well-developed and well-nourished.  HENT:  Head: Normocephalic.  Normal  dentition. Pharynx normal. Bilateral TMs normal. Right maxillary sinus tenderness. No overlying skin changes  Eyes: EOM are normal.  Neck: Normal range of motion.  Pulmonary/Chest: Effort normal.  Abdominal: He exhibits no distension.  Musculoskeletal: Normal range of motion.  Neurological: He is alert and oriented to person, place, and time.  Psychiatric: He has a normal mood and affect.  Nursing note and vitals reviewed.   ED Course  Procedures (including critical care time) Labs Review Labs Reviewed - No data to display  Imaging Review No results found. I have  personally reviewed and evaluated these images and lab results as part of my medical decision-making.   EKG Interpretation None      MDM   Final diagnoses:  Acute maxillary sinusitis, recurrence not specified    Will tx for sinusitis. Well appearing.     Jola Schmidt, MD 03/15/15 1736

## 2015-03-15 NOTE — ED Notes (Signed)
Pt states sinus issues x 1 week. States he blew his nose and noticed only blood and puss, per pt. States feels like right side of face is swollen with right ear pain and blurred vision to right eye, which began today at ~1430. Pt states gums feel swollen too, along with chest congestion.

## 2015-03-15 NOTE — Discharge Instructions (Signed)

## 2015-03-31 NOTE — Sleep Study (Addendum)
  [facility] Laughter, MD     [email or web address]             NOCTURNAL POLYSOMNOGRAPHY   LOCATION: [facility]  Patient Name: Antonio Griffith, Antonio Griffith Date: 03/12/2015 Gender: Male D.O.B: January 25, 1986 Age (years): 4 Referring Provider: Not Available Height (inches): 69 Interpreting Physician: Phillips Odor MD, ABSM Weight (lbs): 300 RPSGT: Peak, Robert BMI: 44 MRN: [id number] Neck Size: 19.50 CLINICAL INFORMATION Sleep Study Type: NPSG Indication for sleep study: Fatigue Epworth Sleepiness Score: 15 SLEEP STUDY TECHNIQUE As per the AASM Manual for the Scoring of Sleep and Associated Events v2.3 (April 2016) with a hypopnea requiring 4% desaturations. The channels recorded and monitored were frontal, central and occipital EEG, electrooculogram (EOG), submentalis EMG (chin), nasal and oral airflow, thoracic and abdominal wall motion, anterior tibialis EMG, snore microphone, electrocardiogram, and pulse oximetry. MEDICATIONS Prior to Admission medications   Medication Sig Start Date End Date Taking? Authorizing Provider  amoxicillin-clavulanate (AUGMENTIN) 875-125 MG tablet Take 1 tablet by mouth every 12 (twelve) hours. 03/15/15   Jola Schmidt, MD  dexlansoprazole (DEXILANT) 60 MG capsule Take 1 capsule (60 mg total) by mouth daily. 12/19/14   Mahala Menghini, PA-C  Linaclotide (LINZESS) 290 MCG CAPS capsule Take 290 mcg by mouth daily.    Historical Provider, MD    Patient's medications include: N/A. Medications self-administered by patient during sleep study : No sleep medicine administered. SLEEP ARCHITECTURE The study was initiated at 9:36:33 PM and ended at 4:45:26 AM. Sleep onset time was 36.2 minutes and the sleep efficiency was 86.1%. The total sleep time was 369.2 minutes. Stage REM latency was 168.0 minutes. The patient spent 7.04% of the night in stage N1 sleep, 63.03% in stage N2 sleep, 16.12% in stage N3 and 13.81% in REM. Alpha intrusion was  absent. Supine sleep was 73.73%. RESPIRATORY PARAMETERS The overall apnea/hypopnea index (AHI) was 25.8 per hour. There were 12 total apneas, including 12 obstructive, 0 central and 0 mixed apneas. There were 147 hypopneas and 0 RERAs. The AHI during Stage REM sleep was 67.1 per hour. AHI while supine was 23.6 per hour. The mean oxygen saturation was 93.03%. The minimum SpO2 during sleep was 81.00%. Loud snoring was noted during this study. CARDIAC DATA The 2 lead EKG demonstrated sinus rhythm. The mean heart rate was 70.58 beats per minute. Other EKG findings include: None. LEG MOVEMENT DATA The total PLMS were 0 with a resulting PLMS index of 0.00. Associated arousal with leg movement index was 0.0.  IMPRESSIONS - Moderate obstructive sleep apnea. AutoPAP 10-20 suggested.    Delano Metz, MD Diplomate, American Board of Sleep Medicine.

## 2015-04-07 ENCOUNTER — Emergency Department (HOSPITAL_COMMUNITY)
Admission: EM | Admit: 2015-04-07 | Discharge: 2015-04-07 | Disposition: A | Discharge status: Home / Self Care | Payer: BLUE CROSS/BLUE SHIELD | Attending: Emergency Medicine | Admitting: Emergency Medicine

## 2015-04-07 ENCOUNTER — Encounter (HOSPITAL_COMMUNITY): Payer: Self-pay | Admitting: Emergency Medicine

## 2015-04-07 DIAGNOSIS — K589 Irritable bowel syndrome without diarrhea: Secondary | ICD-10-CM | POA: Insufficient documentation

## 2015-04-07 DIAGNOSIS — H748X2 Other specified disorders of left middle ear and mastoid: Secondary | ICD-10-CM | POA: Insufficient documentation

## 2015-04-07 DIAGNOSIS — K0889 Other specified disorders of teeth and supporting structures: Secondary | ICD-10-CM | POA: Insufficient documentation

## 2015-04-07 DIAGNOSIS — H748X1 Other specified disorders of right middle ear and mastoid: Secondary | ICD-10-CM | POA: Insufficient documentation

## 2015-04-07 DIAGNOSIS — F17210 Nicotine dependence, cigarettes, uncomplicated: Secondary | ICD-10-CM | POA: Insufficient documentation

## 2015-04-07 DIAGNOSIS — K219 Gastro-esophageal reflux disease without esophagitis: Secondary | ICD-10-CM | POA: Insufficient documentation

## 2015-04-07 DIAGNOSIS — G8929 Other chronic pain: Secondary | ICD-10-CM | POA: Insufficient documentation

## 2015-04-07 DIAGNOSIS — Z86010 Personal history of colonic polyps: Secondary | ICD-10-CM | POA: Insufficient documentation

## 2015-04-07 DIAGNOSIS — J329 Chronic sinusitis, unspecified: Secondary | ICD-10-CM | POA: Insufficient documentation

## 2015-04-07 MED ORDER — PREDNISONE 10 MG PO TABS: ORAL_TABLET | ORAL | Status: DC

## 2015-04-07 MED ORDER — CETIRIZINE-PSEUDOEPHEDRINE ER 5-120 MG PO TB12: 1.0000 | ORAL_TABLET | Freq: Two times a day (BID) | ORAL | Status: DC

## 2015-04-07 MED ORDER — METHYLPREDNISOLONE SODIUM SUCC 125 MG IJ SOLR
80.0000 mg | Freq: Once | INTRAMUSCULAR | Status: AC
  Administered 2015-04-07: 80 mg via INTRAMUSCULAR
  Filled 2015-04-07: qty 2

## 2015-04-07 NOTE — ED Notes (Signed)
Treated here two weeks ago and given antibiotics.  Nasal congestion is not any better.

## 2015-04-07 NOTE — Discharge Instructions (Signed)
Sinusitis, Adult  Sinusitis is redness, soreness, and puffiness (inflammation) of the air pockets in the bones of your face (sinuses). The redness, soreness, and puffiness can cause air and mucus to get trapped in your sinuses. This can allow germs to grow and cause an infection.   HOME CARE    Drink enough fluids to keep your pee (urine) clear or pale yellow.   Use a humidifier in your home.   Run a hot shower to create steam in the bathroom. Sit in the bathroom with the door closed. Breathe in the steam 3-4 times a day.   Put a warm, moist washcloth on your face 3-4 times a day, or as told by your doctor.   Use salt water sprays (saline sprays) to wet the thick fluid in your nose. This can help the sinuses drain.   Only take medicine as told by your doctor.  GET HELP RIGHT AWAY IF:    Your pain gets worse.   You have very bad headaches.   You are sick to your stomach (nauseous).   You throw up (vomit).   You are very sleepy (drowsy) all the time.   Your face is puffy (swollen).   Your vision changes.   You have a stiff neck.   You have trouble breathing.  MAKE SURE YOU:    Understand these instructions.   Will watch your condition.   Will get help right away if you are not doing well or get worse.     This information is not intended to replace advice given to you by your health care provider. Make sure you discuss any questions you have with your health care provider.     Document Released: 08/06/2007 Document Revised: 03/10/2014 Document Reviewed: 09/23/2011  Elsevier Interactive Patient Education 2016 Elsevier Inc.

## 2015-04-07 NOTE — ED Provider Notes (Signed)
CSN: [id number]     Arrival date & time 04/07/15  1627 History   First MD Initiated Contact with Patient 04/07/15 1653     Chief Complaint  Patient presents with  . Nasal Congestion    x two weeks     (Consider location/radiation/quality/duration/timing/severity/associated sxs/prior Treatment) HPI   Antonio Griffith is a 30 y.o. male who presents to the Emergency Department complaining of continued nasal congestion, sinus and facial pressure for two weeks.  He states he was seen here on 03/15/15 for same and treated with a 7 day course of Augmentin.  He state that he felt slightly better for 2 days then symptoms returned.  He reports pain to his face, dental pain, difficulty breathing from his nose and intermittent frontal headaches.  He has also tried using a Nettie pot, Afrin spray and Mucinex without relief.  He denies fever, neck pain or stiffness, visual changes, chills, chest pain, and cough.     Past Medical History  Diagnosis Date  . Chronic back pain   . Colon polyps 2015  . GERD (gastroesophageal reflux disease)   . IBS (irritable bowel syndrome)   . Hyperplastic colon polyp    Past Surgical History  Procedure Laterality Date  . Knee surgery    . Cholecystectomy  12/26/13  . Colonoscopy  11/2013    Polypectomy x 4  . Esophagogastroduodenoscopy  11/2013  . Esophagogastroduodenoscopy N/A 03/29/2014    [id number] ESOPHAGEAL EROSION   . Flexible sigmoidoscopy N/A 03/29/2014    Dr.Rourk- Anal canal internal hemorrhoids, likely source of paper hematochezia. Diminutive rectal and sigmoid polyps were removed bx= hyperplastic polyps   Family History  Problem Relation Age of Onset  . Colon cancer Neg Hx    Social History  Substance Use Topics  . Smoking status: Current Every Day Smoker -- 1.00 packs/day    Types: Cigarettes    Last Attempt to Quit: 09/11/2013  . Smokeless tobacco: Never Used  . Alcohol Use: No     Comment: one a month    Review of Systems   Constitutional: Negative for fever, chills, activity change and appetite change.  HENT: Positive for congestion, ear pain, rhinorrhea, sinus pressure and sore throat. Negative for facial swelling and trouble swallowing.   Eyes: Negative for visual disturbance.  Respiratory: Negative for cough, shortness of breath, wheezing and stridor.   Gastrointestinal: Negative for nausea, vomiting and abdominal pain.  Musculoskeletal: Negative for neck pain and neck stiffness.  Skin: Negative.  Negative for rash.  Neurological: Negative for dizziness, weakness, numbness and headaches.  Hematological: Negative for adenopathy.  Psychiatric/Behavioral: Negative for confusion.  All other systems reviewed and are negative.     Allergies  Peanut-containing drug products  Home Medications   Prior to Admission medications   Medication Sig Start Date End Date Taking? Authorizing Provider  dexlansoprazole (DEXILANT) 60 MG capsule Take 1 capsule (60 mg total) by mouth daily. 12/19/14  Yes Mahala Menghini, PA-C  Linaclotide (LINZESS) 290 MCG CAPS capsule Take 290 mcg by mouth daily.   Yes Historical Provider, MD  sodium chloride (OCEAN) 0.65 % SOLN nasal spray Place 1 spray into both nostrils as needed for congestion.   Yes Historical Provider, MD  amoxicillin-clavulanate (AUGMENTIN) 875-125 MG tablet Take 1 tablet by mouth every 12 (twelve) hours. Patient not taking: Reported on 04/07/2015 03/15/15   Jola Schmidt, MD   BP 155/97 mmHg  Pulse 86  Temp(Src) 97.8 F (36.6 C) (Oral)  Resp 14  Ht  5\' 9"  (1.753 m)  Wt 136.079 kg  BMI 44.28 kg/m2  SpO2 98% Physical Exam  Constitutional: He is oriented to person, place, and time. He appears well-developed and well-nourished. No distress.  HENT:  Head: Normocephalic and atraumatic.  Right Ear: Tympanic membrane is not erythematous and not bulging. A middle ear effusion is present.  Left Ear: Tympanic membrane is not erythematous and not bulging. A middle ear  effusion is present.  Nose: Mucosal edema and rhinorrhea present. No septal deviation or nasal septal hematoma. Right sinus exhibits maxillary sinus tenderness and frontal sinus tenderness. Left sinus exhibits maxillary sinus tenderness and frontal sinus tenderness.  Mouth/Throat: Uvula is midline, oropharynx is clear and moist and mucous membranes are normal.  Eyes: Pupils are equal, round, and reactive to light.  Neck: Normal range of motion. Neck supple.  Cardiovascular: Normal rate, regular rhythm and intact distal pulses.   No murmur heard. Pulmonary/Chest: Effort normal and breath sounds normal. No respiratory distress.  Musculoskeletal: Normal range of motion.  Neurological: He is alert and oriented to person, place, and time. He exhibits normal muscle tone. Coordination normal.  Skin: Skin is warm and dry.  Nursing note and vitals reviewed.   ED Course  Procedures (including critical care time) Labs Review Labs Reviewed - No data to display  Imaging Review No results found. I have personally reviewed and evaluated these images and lab results as part of my medical decision-making.   EKG Interpretation None      MDM   Final diagnoses:  Recurrent sinusitis, unspecified chronicity, unspecified location    Pt here with return visit for sinusitis.  Completed 7 day course of Augmentin.  No fever, chills, persistent headache or neuro deficits.  Further antibiotics not indicated.  IM Decadron given here and Rx  taper prednisone pack and zyrtec D.  Advised to f/u with PMD if needed. Patient appears stable for discharge and agrees to plan.   Kem Parkinson, PA-C 04/09/15 [place], DO 04/11/15 1717

## 2015-04-09 ENCOUNTER — Telehealth: Payer: Self-pay | Admitting: Internal Medicine

## 2015-04-09 NOTE — Telephone Encounter (Signed)
Pt said it would cost him $275 to get his Dexilant and with his new insurance he would have to meet his deductible before they would pay anything. He has been out of his dexilant for 3 days and is asking if he could get more samples. Please advise and he would like a return call today if possible. [id number]

## 2015-04-09 NOTE — Telephone Encounter (Signed)
I am leaving 3 boxes of Dexilant 60 mg to take one tablet daily. Pt is aware. He is aware Almyra Free will check on his meds and see what his insurance best covers.

## 2015-04-13 ENCOUNTER — Emergency Department (HOSPITAL_COMMUNITY)
Admission: EM | Admit: 2015-04-13 | Discharge: 2015-04-13 | Disposition: A | Discharge status: Home / Self Care | Payer: Self-pay | Attending: Emergency Medicine | Admitting: Emergency Medicine

## 2015-04-13 ENCOUNTER — Encounter (HOSPITAL_COMMUNITY): Payer: Self-pay | Admitting: Cardiology

## 2015-04-13 ENCOUNTER — Emergency Department (HOSPITAL_COMMUNITY): Payer: Self-pay

## 2015-04-13 DIAGNOSIS — F17210 Nicotine dependence, cigarettes, uncomplicated: Secondary | ICD-10-CM | POA: Insufficient documentation

## 2015-04-13 DIAGNOSIS — R110 Nausea: Secondary | ICD-10-CM | POA: Insufficient documentation

## 2015-04-13 DIAGNOSIS — Z79899 Other long term (current) drug therapy: Secondary | ICD-10-CM | POA: Insufficient documentation

## 2015-04-13 DIAGNOSIS — R61 Generalized hyperhidrosis: Secondary | ICD-10-CM | POA: Insufficient documentation

## 2015-04-13 DIAGNOSIS — G8929 Other chronic pain: Secondary | ICD-10-CM | POA: Insufficient documentation

## 2015-04-13 DIAGNOSIS — Z86010 Personal history of colonic polyps: Secondary | ICD-10-CM | POA: Insufficient documentation

## 2015-04-13 DIAGNOSIS — R0602 Shortness of breath: Secondary | ICD-10-CM | POA: Insufficient documentation

## 2015-04-13 DIAGNOSIS — R42 Dizziness and giddiness: Secondary | ICD-10-CM | POA: Insufficient documentation

## 2015-04-13 DIAGNOSIS — K219 Gastro-esophageal reflux disease without esophagitis: Secondary | ICD-10-CM | POA: Insufficient documentation

## 2015-04-13 DIAGNOSIS — R079 Chest pain, unspecified: Secondary | ICD-10-CM | POA: Insufficient documentation

## 2015-04-13 LAB — BASIC METABOLIC PANEL
Anion gap: 9 (ref 5–15)
BUN: 21 mg/dL — ABNORMAL HIGH (ref 6–20)
CO2: 25 mmol/L (ref 22–32)
Calcium: 8.7 mg/dL — ABNORMAL LOW (ref 8.9–10.3)
Chloride: 108 mmol/L (ref 101–111)
Creatinine, Ser: 1.01 mg/dL (ref 0.61–1.24)
GFR calc Af Amer: >60 mL/min (ref >60)
GFR calc non Af Amer: >60 mL/min (ref >60)
Glucose, Bld: 91 mg/dL (ref 65–99)
Potassium: 3.5 mmol/L (ref 3.5–5.1)
Sodium: 142 mmol/L (ref 135–145)

## 2015-04-13 LAB — CBC
HCT: 49.4 % (ref 39.0–52.0)
Hemoglobin: 17.3 g/dL — ABNORMAL HIGH (ref 13.0–17.0)
MCH: 30.3 pg (ref 26.0–34.0)
MCHC: 35 g/dL (ref 30.0–36.0)
MCV: 86.5 fL (ref 78.0–100.0)
Platelets: 237 10*3/uL (ref 150–400)
RBC: 5.71 MIL/uL (ref 4.22–5.81)
RDW: 13.1 % (ref 11.5–15.5)
WBC: 11.9 10*3/uL — ABNORMAL HIGH (ref 4.0–10.5)

## 2015-04-13 LAB — TROPONIN I
Troponin I: <0.03 ng/mL (ref <0.031)
Troponin I: <0.03 ng/mL (ref <0.031)

## 2015-04-13 MED ORDER — ONDANSETRON HCL 4 MG/2ML IJ SOLN
4.0000 mg | Freq: Once | INTRAMUSCULAR | Status: AC
  Administered 2015-04-13: 4 mg via INTRAMUSCULAR
  Filled 2015-04-13: qty 2

## 2015-04-13 MED ORDER — GI COCKTAIL ~~LOC~~
ORAL | Status: AC
  Filled 2015-04-13: qty 30

## 2015-04-13 MED ORDER — HYDROMORPHONE HCL 1 MG/ML IJ SOLN
1.0000 mg | Freq: Once | INTRAMUSCULAR | Status: AC
  Administered 2015-04-13: 1 mg via INTRAVENOUS
  Filled 2015-04-13: qty 1

## 2015-04-13 MED ORDER — FAMOTIDINE IN NACL 20-0.9 MG/50ML-% IV SOLN
20.0000 mg | Freq: Once | INTRAVENOUS | Status: AC
  Administered 2015-04-13: 20 mg via INTRAVENOUS
  Filled 2015-04-13: qty 50

## 2015-04-13 MED ORDER — GI COCKTAIL ~~LOC~~
30.0000 mL | Freq: Once | ORAL | Status: AC
  Administered 2015-04-13: 30 mL via ORAL
  Filled 2015-04-13: qty 30

## 2015-04-13 MED ORDER — SODIUM CHLORIDE 0.9 % IV BOLUS (SEPSIS)
1000.0000 mL | Freq: Once | INTRAVENOUS | Status: AC
  Administered 2015-04-13: 1000 mL via INTRAVENOUS

## 2015-04-13 NOTE — Discharge Instructions (Signed)
Nonspecific Chest Pain  °Chest pain can be caused by many different conditions. There is always a chance that your pain could be related to something serious, such as a heart attack or a blood clot in your lungs. Chest pain can also be caused by conditions that are not life-threatening. If you have chest pain, it is very important to follow up with your health care provider. °CAUSES  °Chest pain can be caused by: °· Heartburn. °· Pneumonia or bronchitis. °· Anxiety or stress. °· Inflammation around your heart (pericarditis) or lung (pleuritis or pleurisy). °· A blood clot in your lung. °· A collapsed lung (pneumothorax). It can develop suddenly on its own (spontaneous pneumothorax) or from trauma to the chest. °· Shingles infection (varicella-zoster virus). °· Heart attack. °· Damage to the bones, muscles, and cartilage that make up your chest wall. This can include: °¨ Bruised bones due to injury. °¨ Strained muscles or cartilage due to frequent or repeated coughing or overwork. °¨ Fracture to one or more ribs. °¨ Sore cartilage due to inflammation (costochondritis). °RISK FACTORS  °Risk factors for chest pain may include: °· Activities that increase your risk for trauma or injury to your chest. °· Respiratory infections or conditions that cause frequent coughing. °· Medical conditions or overeating that can cause heartburn. °· Heart disease or family history of heart disease. °· Conditions or health behaviors that increase your risk of developing a blood clot. °· Having had chicken pox (varicella zoster). °SIGNS AND SYMPTOMS °Chest pain can feel like: °· Burning or tingling on the surface of your chest or deep in your chest. °· Crushing, pressure, aching, or squeezing pain. °· Dull or sharp pain that is worse when you move, cough, or take a deep breath. °· Pain that is also felt in your back, neck, shoulder, or arm, or pain that spreads to any of these areas. °Your chest pain may come and go, or it may stay  constant. °DIAGNOSIS °Lab tests or other studies may be needed to find the cause of your pain. Your health care provider may have you take a test called an ambulatory ECG (electrocardiogram). An ECG records your heartbeat patterns at the time the test is performed. You may also have other tests, such as: °· Transthoracic echocardiogram (TTE). During echocardiography, sound waves are used to create a picture of all of the heart structures and to look at how blood flows through your heart. °· Transesophageal echocardiogram (TEE). This is a more advanced imaging test that obtains images from inside your body. It allows your health care provider to see your heart in finer detail. °· Cardiac monitoring. This allows your health care provider to monitor your heart rate and rhythm in real time. °· Holter monitor. This is a portable device that records your heartbeat and can help to diagnose abnormal heartbeats. It allows your health care provider to track your heart activity for several days, if needed. °· Stress tests. These can be done through exercise or by taking medicine that makes your heart beat more quickly. °· Blood tests. °· Imaging tests. °TREATMENT  °Your treatment depends on what is causing your chest pain. Treatment may include: °· Medicines. These may include: °¨ Acid blockers for heartburn. °¨ Anti-inflammatory medicine. °¨ Pain medicine for inflammatory conditions. °¨ Antibiotic medicine, if an infection is present. °¨ Medicines to dissolve blood clots. °¨ Medicines to treat coronary artery disease. °· Supportive care for conditions that do not require medicines. This may include: °¨ Resting. °¨ Applying heat   or cold packs to injured areas. °¨ Limiting activities until pain decreases. °HOME CARE INSTRUCTIONS °· If you were prescribed an antibiotic medicine, finish it all even if you start to feel better. °· Avoid any activities that bring on chest pain. °· Do not use any tobacco products, including  cigarettes, chewing tobacco, or electronic cigarettes. If you need help quitting, ask your health care provider. °· Do not drink alcohol. °· Take medicines only as directed by your health care provider. °· Keep all follow-up visits as directed by your health care provider. This is important. This includes any further testing if your chest pain does not go away. °· If heartburn is the cause for your chest pain, you may be told to keep your head raised (elevated) while sleeping. This reduces the chance that acid will go from your stomach into your esophagus. °· Make lifestyle changes as directed by your health care provider. These may include: °¨ Getting regular exercise. Ask your health care provider to suggest some activities that are safe for you. °¨ Eating a heart-healthy diet. A registered dietitian can help you to learn healthy eating options. °¨ Maintaining a healthy weight. °¨ Managing diabetes, if necessary. °¨ Reducing stress. °SEEK MEDICAL CARE IF: °· Your chest pain does not go away after treatment. °· You have a rash with blisters on your chest. °· You have a fever. °SEEK IMMEDIATE MEDICAL CARE IF:  °· Your chest pain is worse. °· You have an increasing cough, or you cough up blood. °· You have severe abdominal pain. °· You have severe weakness. °· You faint. °· You have chills. °· You have sudden, unexplained chest discomfort. °· You have sudden, unexplained discomfort in your arms, back, neck, or jaw. °· You have shortness of breath at any time. °· You suddenly start to sweat, or your skin gets clammy. °· You feel nauseous or you vomit. °· You suddenly feel light-headed or dizzy. °· Your heart begins to beat quickly, or it feels like it is skipping beats. °These symptoms may represent a serious problem that is an emergency. Do not wait to see if the symptoms will go away. Get medical help right away. Call your local emergency services (911 in the [place] Do not drive yourself to the hospital. °  °This  information is not intended to replace advice given to you by your health care provider. Make sure you discuss any questions you have with your health care provider. °  °Document Released: 11/27/2004 Document Revised: 03/10/2014 Document Reviewed: 09/23/2013 °Elsevier Interactive Patient Education ©2016 Elsevier Inc. ° °

## 2015-04-13 NOTE — ED Provider Notes (Signed)
CSN: [id number]     Arrival date & time 04/13/15  0803 History  By signing my name below, I, Terressa Koyanagi, attest that this documentation has been prepared under the direction and in the presence of [name], MD. Electronically Signed: Terressa Koyanagi, ED Scribe. 04/13/2015. 9:08 AM.   Chief Complaint  Patient presents with  . Chest Pain   The history is provided by the patient. No language interpreter was used.   PCP: Phillips Odor, MD HPI Comments: Antonio Griffith is a 30 y.o. male, with PMHx noted below, who presents to the Emergency Department complaining of severe, waxing and waning, acute chest pain onset this morning at 6AM. Associated Sx include diaphoresis, dizziness, nausea and SOB. Pt reports lying flat on his back alleviates the Sx. Pt reports taking gaviscon  and dexilant without relief. Pt denies fever, chills, sore throat. Pt denies EtOH use. Pt further denies any Hx of blood clots. Pt denies any medicinal allergies.  Past Medical History  Diagnosis Date  . Chronic back pain   . Colon polyps 2015  . GERD (gastroesophageal reflux disease)   . IBS (irritable bowel syndrome)   . Hyperplastic colon polyp    Past Surgical History  Procedure Laterality Date  . Knee surgery    . Cholecystectomy  12/26/13  . Colonoscopy  11/2013    Polypectomy x 4  . Esophagogastroduodenoscopy  11/2013  . Esophagogastroduodenoscopy N/A 03/29/2014    [id number] ESOPHAGEAL EROSION   . Flexible sigmoidoscopy N/A 03/29/2014    Dr.Rourk- Anal canal internal hemorrhoids, likely source of paper hematochezia. Diminutive rectal and sigmoid polyps were removed bx= hyperplastic polyps   Family History  Problem Relation Age of Onset  . Colon cancer Neg Hx    Social History  Substance Use Topics  . Smoking status: Current Every Day Smoker -- 1.00 packs/day    Types: Cigarettes    Last Attempt to Quit: 09/11/2013  . Smokeless tobacco: Never Used  . Alcohol Use: No     Comment: one a month     Review of Systems  Constitutional: Positive for diaphoresis. Negative for fever and chills.  HENT: Negative for sore throat.   Respiratory: Positive for shortness of breath.   Cardiovascular: Positive for chest pain.  Gastrointestinal: Positive for nausea.  Neurological: Positive for dizziness.  All other systems reviewed and are negative.     Allergies  Peanut-containing drug products  Home Medications   Prior to Admission medications   Medication Sig Start Date End Date Taking? Authorizing Provider  amoxicillin-clavulanate (AUGMENTIN) 875-125 MG tablet Take 1 tablet by mouth every 12 (twelve) hours. Patient not taking: Reported on 04/07/2015 03/15/15   Jola Schmidt, MD  cetirizine-pseudoephedrine (ZYRTEC-D) 5-120 MG tablet Take 1 tablet by mouth 2 (two) times daily. 04/07/15   Tammy Triplett, PA-C  dexlansoprazole (DEXILANT) 60 MG capsule Take 1 capsule (60 mg total) by mouth daily. 12/19/14   Mahala Menghini, PA-C  Linaclotide (LINZESS) 290 MCG CAPS capsule Take 290 mcg by mouth daily.    Historical Provider, MD  predniSONE (DELTASONE) 10 MG tablet Take 6 tablets day one, 5 tablets day two, 4 tablets day three, 3 tablets day four, 2 tablets day five, then 1 tablet day six 04/07/15   Tammy Triplett, PA-C  sodium chloride (OCEAN) 0.65 % SOLN nasal spray Place 1 spray into both nostrils as needed for congestion.    Historical Provider, MD   Triage Vitals: BP 130/76 mmHg  Pulse 87  Temp(Src) 97.7 F (  36.5 C) (Oral)  Resp 20  Ht 5\' 9"  (1.753 m)  Wt 300 lb (136.079 kg)  BMI 44.28 kg/m2  SpO2 100% Physical Exam  Constitutional: He is oriented to person, place, and time. He appears well-developed and well-nourished.  Appears uncomfortable    HENT:  Head: Normocephalic and atraumatic.  Eyes: EOM are normal.  Neck: Normal range of motion.  Cardiovascular: Normal rate, regular rhythm, normal heart sounds and intact distal pulses.   Pulmonary/Chest: Effort normal and breath sounds  normal. No respiratory distress.  Abdominal: Soft. He exhibits no distension. There is no tenderness.  Musculoskeletal: Normal range of motion.  Neurological: He is alert and oriented to person, place, and time.  Skin: Skin is warm and dry.  Psychiatric: He has a normal mood and affect. Judgment normal.  Nursing note and vitals reviewed.   ED Course  Procedures (including critical care time) DIAGNOSTIC STUDIES: Oxygen Saturation is 100% on ra, nl by my interpretation.    COORDINATION OF CARE: 8:50AM AM: Discussed treatment plan which includes labs, imaging, and meds for pain management with pt at bedside; patient verbalizes understanding and agrees with treatment plan.  Labs Review Labs Reviewed  CBC - Abnormal; Notable for the following:    WBC 11.9 (*)    Hemoglobin 17.3 (*)    All other components within normal limits  BASIC METABOLIC PANEL  TROPONIN I    Imaging Review Dg Chest Portable 1 View  04/13/2015  CLINICAL DATA:  Chest pain and epigastric pain starting this morning. EXAM: PORTABLE CHEST 1 VIEW COMPARISON:  10/30/2014 FINDINGS: Hazy density projecting above both hemidiaphragms, probably attributable to the soft tissues of the chest wall rather than underlying airspace opacity. Lordotic projection noted. Cardiac and mediastinal margins appear normal. No blunting of the costophrenic angles. IMPRESSION: 1. Hazy density above both hemidiaphragms, probably attributable to body habitus and soft tissues of the chest wall rather than an underlying airspace opacity. Lateral radiography may help to further confirm this, if clinically feasible. Otherwise negative exam. Electronically Signed   By: Van Clines M.D.   On: 04/13/2015 08:40   I have personally reviewed and evaluated these images and lab results as part of my medical decision-making.   EKG Interpretation   Date/Time:  Friday April 13 2015 08:09:59 EST Ventricular Rate:  85 PR Interval:  153 QRS Duration:  107 QT Interval:  344 QTC Calculation: 409 R Axis:   103 Text Interpretation:  Sinus rhythm Right axis deviation No significant  change since last tracing Confirmed by Beverly Ferner  MD, Ajahni Nay ([id number]) on  04/13/2015 8:30:52 AM      MDM   Final diagnoses:  Chest pain, unspecified chest pain type   30 year old male with chest pain. Atypical for ACS. Doubt dissection, point embolism, serious infection or other emergent process.It has been determined that no acute conditions requiring further emergency intervention are present at this time. The patient has been advised of the diagnosis and plan. I reviewed any labs and imaging including any potential incidental findings. We have discussed signs and symptoms that warrant return to the ED and they are listed in the discharge instructions.   I personally preformed the services scribed in my presence. The recorded information has been reviewed is accurate. [name], MD.    [name], MD 04/17/15 [phone]

## 2015-04-13 NOTE — ED Notes (Signed)
Chest pain since 6 am

## 2015-04-16 ENCOUNTER — Other Ambulatory Visit: Payer: Self-pay | Admitting: Gastroenterology

## 2015-04-16 ENCOUNTER — Encounter: Payer: Self-pay | Admitting: Gastroenterology

## 2015-04-16 ENCOUNTER — Ambulatory Visit (INDEPENDENT_AMBULATORY_CARE_PROVIDER_SITE_OTHER): Payer: BLUE CROSS/BLUE SHIELD | Admitting: Gastroenterology

## 2015-04-16 ENCOUNTER — Telehealth: Payer: Self-pay

## 2015-04-16 VITALS — BP 144/96 | HR 82 | Temp 97.5°F | Ht 69.0 in | Wt 307.0 lb

## 2015-04-16 DIAGNOSIS — R1013 Epigastric pain: Secondary | ICD-10-CM | POA: Insufficient documentation

## 2015-04-16 LAB — CBC
HCT: 47.8 % (ref 39.0–52.0)
Hemoglobin: 16.4 g/dL (ref 13.0–17.0)
MCH: 29.8 pg (ref 26.0–34.0)
MCHC: 34.3 g/dL (ref 30.0–36.0)
MCV: 86.9 fL (ref 78.0–100.0)
MPV: 11.7 fL (ref 8.6–12.4)
Platelets: 252 10*3/uL (ref 150–400)
RBC: 5.5 MIL/uL (ref 4.22–5.81)
RDW: 13.4 % (ref 11.5–15.5)
WBC: 9.2 10*3/uL (ref 4.0–10.5)

## 2015-04-16 LAB — COMPLETE METABOLIC PANEL WITH GFR
ALT: 77 U/L — ABNORMAL HIGH (ref 9–46)
AST: 22 U/L (ref 10–40)
Albumin: 3.7 g/dL (ref 3.6–5.1)
Alkaline Phosphatase: 59 U/L (ref 40–115)
BUN: 13 mg/dL (ref 7–25)
CO2: 29 mmol/L (ref 20–31)
Calcium: 8.8 mg/dL (ref 8.6–10.3)
Chloride: 103 mmol/L (ref 98–110)
Creat: 0.88 mg/dL (ref 0.60–1.35)
GFR, Est African American: >89 mL/min (ref >=60)
GFR, Est Non African American: >89 mL/min (ref >=60)
Glucose, Bld: 104 mg/dL — ABNORMAL HIGH (ref 65–99)
Potassium: 4.2 mmol/L (ref 3.5–5.3)
Sodium: 139 mmol/L (ref 135–146)
Total Bilirubin: 0.4 mg/dL (ref 0.2–1.2)
Total Protein: 6.7 g/dL (ref 6.1–8.1)

## 2015-04-16 LAB — LIPASE: Lipase: 16 U/L (ref 7–60)

## 2015-04-16 MED ORDER — SUCRALFATE 1 GM/10ML PO SUSP: 1.0000 g | Freq: Four times a day (QID) | ORAL | Status: DC

## 2015-04-16 MED ORDER — ONDANSETRON HCL 4 MG PO TABS: 4.0000 mg | ORAL_TABLET | Freq: Three times a day (TID) | ORAL | Status: DC | PRN

## 2015-04-16 NOTE — Telephone Encounter (Signed)
Pt called- he started having chest pain on Friday. Per pt he went to the ED and they told him it was not his heart, gave him a "pink liquid" that helped the pain and sent him home. He said he has continued to have problems all weekend. This morning he is having bloating, gas, abd pain that shoots around to his back, weakness and he feels like his stomach is going to explode. He ran out of his linzess 271mcg and has been taking linzess 161mcg and MOM. Had a small bm yesterday and a small watery bm today. He said he feels awful and is not sure what to do. He is taking dexilant. I have put the pt on the schedule for today at 11:30 with AS. Pt is aware and stated he would be here.

## 2015-04-16 NOTE — Patient Instructions (Signed)
Please have blood work done today. Just tell the [facility] people to bill you later. We checked with them, and they said you can request for it to be billed.   You may need a CT or an upper endoscopy in the near future. We will see what the blood work shows first.   Stop ibuprofen. Continue Dexilant. I have added Carafate to take four times a day. You may take Zofran for nausea as needed.

## 2015-04-16 NOTE — Telephone Encounter (Signed)
Pt coming in for an ov today. I need a copy of his current.insurance card.

## 2015-04-16 NOTE — Progress Notes (Signed)
Referring Provider: Phillips Odor, MD Primary Care Physician:  Phillips Odor, MD  Primary GI: Dr. Gala Romney   Chief Complaint  Patient presents with  . Abdominal Pain    HPI:   Antonio Griffith is a 30 y.o. male presenting today with a history of GERD and constipation. Last EGD in Jan 2016 with erosive reflux esophagitis. Colonoscopy surveillance due in 2019.   Friday got up ready for work, got a hunger pang. Started hurting in his abdomen. Felt weak, drained, anything he ate wouldn't help. Pain in RUQ/right chest. Cardiac etiology ruled out. Gallbladder absent. Has been feeling very jittery this morning. Still has pain in epigastric area underneath sternum. Can't eat a lot. Feels full after eating an apple, ate a banana and felt really full. Gets bloated with eating. No fever or chills. Feels weak. If he doesn't take the Linzess, has constipation. Supposed to be on the 290 mcg, which worked well, but then he ran out. Had a mishap with insurance and insurance won't pay until he reaches his deductible. Has been taking the Dexilant daily. Has 2 Linzess left of the 145 mcg. Nauseated this morning. Every time would hit, nothing would come up. Feels like a balloon that won't explode. Pain is persistent. Pain is a 5/10. Ibuprofen pretty routinely, almost daily. No ETOH in 2 years. He has been taking Ibuprofen routinely.     Past Medical History  Diagnosis Date  . Chronic back pain   . Colon polyps 2015  . GERD (gastroesophageal reflux disease)   . IBS (irritable bowel syndrome)   . Hyperplastic colon polyp     Past Surgical History  Procedure Laterality Date  . Knee surgery    . Cholecystectomy  12/26/13  . Colonoscopy  11/2013    Polypectomy x 4  . Esophagogastroduodenoscopy  11/2013  . Esophagogastroduodenoscopy N/A 03/29/2014    [id number] reflux esophagitis.   . Flexible sigmoidoscopy N/A 03/29/2014    Dr.Rourk- Anal canal internal hemorrhoids, likely source of paper hematochezia.  Diminutive rectal and sigmoid polyps were removed bx= hyperplastic polyps    Current Outpatient Prescriptions  Medication Sig Dispense Refill  . dexlansoprazole (DEXILANT) 60 MG capsule Take 1 capsule (60 mg total) by mouth daily. 90 capsule 3  . Linaclotide (LINZESS) 290 MCG CAPS capsule Take 290 mcg by mouth daily.    . cetirizine-pseudoephedrine (ZYRTEC-D) 5-120 MG tablet Take 1 tablet by mouth 2 (two) times daily. (Patient not taking: Reported on 04/16/2015) 30 tablet 0  . predniSONE (DELTASONE) 10 MG tablet Take 6 tablets day one, 5 tablets day two, 4 tablets day three, 3 tablets day four, 2 tablets day five, then 1 tablet day six (Patient not taking: Reported on 04/16/2015) 21 tablet 0  . sodium chloride (OCEAN) 0.65 % SOLN nasal spray Place 1 spray into both nostrils as needed for congestion. Reported on 04/16/2015     No current facility-administered medications for this visit.    Allergies as of 04/16/2015 - Review Complete 04/16/2015  Allergen Reaction Noted  . Peanut-containing drug products Hives 11/28/2014    Family History  Problem Relation Age of Onset  . Colon cancer Neg Hx     Social History   Social History  . Marital Status: Married    Spouse Name: N/A  . Number of Children: N/A  . 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 Smoking  status: Current Every Day Smoker -- 1.00 packs/day    Types: Cigarettes    Last Attempt to Quit: 09/11/2013  . Smokeless tobacco: Never Used  . Alcohol Use: No     Comment: one a month  . Drug Use: No  . Sexual Activity: Not Asked   Other Topics Concern  . None   Social History Narrative    Review of Systems: As mentioned in HPI.   Physical Exam: BP 144/96 mmHg  Pulse 82  Temp(Src) 97.5 F (36.4 C)  Ht 5\' 9"  (1.753 m)  Wt 307 lb (139.254 kg)  BMI 45.32 kg/m2 General:   Alert and oriented. No distress noted. Pleasant and cooperative.    Head:  Normocephalic and atraumatic. Eyes:  Conjuctiva clear without scleral icterus. Mouth:  Oral mucosa pink and moist. Good dentition. No lesions. Neck:  Supple, without mass or thyromegaly. Heart:  S1, S2 present without murmurs, rubs, or gallops. Regular rate and rhythm. Abdomen:  +BS, soft, non-tender and non-distended. No rebound or guarding. No HSM or masses noted. Msk:  Symmetrical without gross deformities. Normal posture. Extremities:  Without edema. Neurologic:  Alert and  oriented x4;  grossly normal neurologically. Skin:  Intact without significant lesions or rashes. Psych:  Alert and cooperative. Normal mood and affect.  Lab Results  Component Value Date   WBC 9.2 04/16/2015   HGB 16.4 04/16/2015   HCT 47.8 04/16/2015   MCV 86.9 04/16/2015   PLT 252 04/16/2015   Lab Results  Component Value Date   ALT 77* 04/16/2015   AST 22 04/16/2015   ALKPHOS 59 04/16/2015   BILITOT 0.4 04/16/2015   Lab Results  Component Value Date   LIPASE 16 04/16/2015

## 2015-04-17 NOTE — Progress Notes (Signed)
Quick Note:  ALT is elevated at 77, and a year ago it was 54. Otherwise, LFTs and lipase are normal. No leukocytosis. I feel we should get a baseline elastography for him. How is he doing with the Carafate? If he has not had significant improvement, needs EGD with Dr. Gala Romney. Let's still get an elastography for baseline. ______

## 2015-04-18 ENCOUNTER — Other Ambulatory Visit: Payer: Self-pay

## 2015-04-18 ENCOUNTER — Telehealth: Payer: Self-pay

## 2015-04-18 ENCOUNTER — Other Ambulatory Visit: Payer: Self-pay | Admitting: Gastroenterology

## 2015-04-18 ENCOUNTER — Ambulatory Visit (HOSPITAL_COMMUNITY)
Admission: RE | Admit: 2015-04-18 | Discharge: 2015-04-18 | Disposition: A | Discharge status: Home / Self Care | Payer: Self-pay | Source: Ambulatory Visit | Attending: Gastroenterology | Admitting: Gastroenterology

## 2015-04-18 DIAGNOSIS — R109 Unspecified abdominal pain: Secondary | ICD-10-CM

## 2015-04-18 DIAGNOSIS — R1011 Right upper quadrant pain: Secondary | ICD-10-CM | POA: Insufficient documentation

## 2015-04-18 DIAGNOSIS — K760 Fatty (change of) liver, not elsewhere classified: Secondary | ICD-10-CM | POA: Insufficient documentation

## 2015-04-18 DIAGNOSIS — R197 Diarrhea, unspecified: Secondary | ICD-10-CM | POA: Insufficient documentation

## 2015-04-18 DIAGNOSIS — R1013 Epigastric pain: Secondary | ICD-10-CM

## 2015-04-18 DIAGNOSIS — D1779 Benign lipomatous neoplasm of other sites: Secondary | ICD-10-CM | POA: Insufficient documentation

## 2015-04-18 DIAGNOSIS — K219 Gastro-esophageal reflux disease without esophagitis: Secondary | ICD-10-CM | POA: Insufficient documentation

## 2015-04-18 DIAGNOSIS — R1110 Vomiting, unspecified: Secondary | ICD-10-CM | POA: Insufficient documentation

## 2015-04-18 LAB — HEPATITIS PANEL, ACUTE
HCV AB: NEGATIVE
HEP A IGM: NONREACTIVE
HEP B C IGM: NONREACTIVE
HEP B S AG: NEGATIVE

## 2015-04-18 MED ORDER — DIATRIZOATE MEGLUMINE & SODIUM 66-10 % PO SOLN
ORAL | Status: AC
  Filled 2015-04-18: qty 30

## 2015-04-18 MED ORDER — IOHEXOL 300 MG/ML  SOLN
100.0000 mL | Freq: Once | INTRAMUSCULAR | Status: AC | PRN
  Administered 2015-04-18: 100 mL via INTRAVENOUS

## 2015-04-18 MED ORDER — ONDANSETRON HCL 4 MG PO TABS: 4.0000 mg | ORAL_TABLET | Freq: Three times a day (TID) | ORAL | Status: DC | PRN

## 2015-04-18 NOTE — Progress Notes (Signed)
Called [facility] and had this added to the labs done yesterday. They had to cancel our order and reenter it so it would be added.

## 2015-04-18 NOTE — Progress Notes (Unsigned)
I have ordered the blood work in this encounter for hepatitis panel.

## 2015-04-18 NOTE — Telephone Encounter (Signed)
Pt is going on the the hospital for the CT scan now and he is set up for the EGD 04/19/15. He not longer has the [facility] for there for he is a self pay for now.

## 2015-04-18 NOTE — Telephone Encounter (Signed)
Zofran sent to pharmacy

## 2015-04-18 NOTE — Assessment & Plan Note (Signed)
30 year old male with new onset dyspepsia, associated nausea, in the setting of routine NSAIDs. Although he called in this morning in obvious discomfort, he is in no distress whatsoever at time of visit. Pain located in epigastric area/RUQ/and chest. Cardiac etiology ruled out in ED. Associated early satiety. Labs drawn after visit with normal lipase, normal CBC, and ALT elevated at 77. Carafate added to regimen, continue Dexilant, and continue Zofran. After labs completed, patient stated he was feeling better with Carafate and desired to hold off on any imaging or upper endoscopy. However, shortly thereafter, he presented to our office in significant discomfort, wife present. I elected to proceed with a stat CT. As long as this is negative, we will then pursue an EGD on Apr 19, 2015, with Dr. Gala Romney to assess for gastritis/PUD in setting of significant NSAID use. Although he had an EGD last year, he has had significant Ibuprofen use and could have developed NSAID-induced etiology for pain. Gallbladder absent. ALT elevation noted, and he may need an ultrasound to assess better for underlying fatty liver disease in a more elective setting. May also need further evaluation of transaminases if any worsening in the future. Risks and benefits of EGD with Dr. Gala Romney discussed with patient, who states understanding. CT stat to be obtained today. Phenergan 25 mg IV on call.

## 2015-04-18 NOTE — Telephone Encounter (Signed)
Pt came by the office this morning with his wife. He started having severe abd pain last night after eating supper. He has been vomiting and sometimes just spitting up liquids. His wife said he was weak this morning and could barely walk when he got up. I spoke with AS- pt needs stat abd ct with and without contrast today (they can sent him home after the ct) he will also need asap egd. He does not need the U/S at this time. Pt and his wife are agreeable with the plan.  Vicente Males, can you send in something for nausea to his pharmacy.  Ginger, please schedule.

## 2015-04-18 NOTE — Progress Notes (Signed)
Quick Note:  Fatty liver noted with increase in RUQ adenopathy, query reactive. Let's get an acute hepatitis panel now. Repeat CT in 6 months. Keep plans for EGD on 2/16. ______

## 2015-04-19 ENCOUNTER — Encounter (HOSPITAL_COMMUNITY)
Admission: RE | Disposition: A | Discharge status: Home / Self Care | Payer: Self-pay | Source: Ambulatory Visit | Attending: Internal Medicine

## 2015-04-19 ENCOUNTER — Ambulatory Visit (HOSPITAL_COMMUNITY)
Admission: RE | Admit: 2015-04-19 | Discharge: 2015-04-19 | Disposition: A | Discharge status: Home / Self Care | Payer: Self-pay | Source: Ambulatory Visit | Attending: Internal Medicine | Admitting: Internal Medicine

## 2015-04-19 ENCOUNTER — Encounter (HOSPITAL_COMMUNITY): Payer: Self-pay

## 2015-04-19 DIAGNOSIS — R1013 Epigastric pain: Secondary | ICD-10-CM | POA: Insufficient documentation

## 2015-04-19 DIAGNOSIS — K589 Irritable bowel syndrome without diarrhea: Secondary | ICD-10-CM | POA: Insufficient documentation

## 2015-04-19 DIAGNOSIS — Z79899 Other long term (current) drug therapy: Secondary | ICD-10-CM | POA: Insufficient documentation

## 2015-04-19 DIAGNOSIS — F17210 Nicotine dependence, cigarettes, uncomplicated: Secondary | ICD-10-CM | POA: Insufficient documentation

## 2015-04-19 DIAGNOSIS — K219 Gastro-esophageal reflux disease without esophagitis: Secondary | ICD-10-CM | POA: Insufficient documentation

## 2015-04-19 DIAGNOSIS — K3189 Other diseases of stomach and duodenum: Secondary | ICD-10-CM | POA: Insufficient documentation

## 2015-04-19 HISTORY — PX: ESOPHAGOGASTRODUODENOSCOPY: SHX5428

## 2015-04-19 SURGERY — EGD (ESOPHAGOGASTRODUODENOSCOPY)
Anesthesia: Moderate Sedation

## 2015-04-19 MED ORDER — PROMETHAZINE HCL 25 MG/ML IJ SOLN
INTRAMUSCULAR | Status: AC
  Filled 2015-04-19: qty 1

## 2015-04-19 MED ORDER — MEPERIDINE HCL 100 MG/ML IJ SOLN
INTRAMUSCULAR | Status: DC | PRN
  Administered 2015-04-19: 25 mg via INTRAVENOUS
  Administered 2015-04-19: 50 mg via INTRAVENOUS

## 2015-04-19 MED ORDER — SODIUM CHLORIDE 0.9 % IV SOLN
INTRAVENOUS | Status: DC
Start: 2015-04-19 — End: 2015-04-21
  Administered 2015-04-19: 14:00:00 via INTRAVENOUS

## 2015-04-19 MED ORDER — ONDANSETRON HCL 4 MG/2ML IJ SOLN
INTRAMUSCULAR | Status: AC
  Filled 2015-04-19: qty 2

## 2015-04-19 MED ORDER — MEPERIDINE HCL 100 MG/ML IJ SOLN
INTRAMUSCULAR | Status: AC
  Filled 2015-04-19: qty 2

## 2015-04-19 MED ORDER — SODIUM CHLORIDE 0.9% FLUSH
INTRAVENOUS | Status: AC
  Filled 2015-04-19: qty 10

## 2015-04-19 MED ORDER — LIDOCAINE VISCOUS 2 % MT SOLN
OROMUCOSAL | Status: DC | PRN
  Administered 2015-04-19: 1 via OROMUCOSAL

## 2015-04-19 MED ORDER — MIDAZOLAM HCL 5 MG/5ML IJ SOLN
INTRAMUSCULAR | Status: DC | PRN
  Administered 2015-04-19: 2 mg via INTRAVENOUS
  Administered 2015-04-19: 1 mg via INTRAVENOUS
  Administered 2015-04-19: 2 mg via INTRAVENOUS

## 2015-04-19 MED ORDER — MIDAZOLAM HCL 5 MG/5ML IJ SOLN
INTRAMUSCULAR | Status: AC
  Filled 2015-04-19: qty 10

## 2015-04-19 MED ORDER — LIDOCAINE VISCOUS 2 % MT SOLN
OROMUCOSAL | Status: AC
  Filled 2015-04-19: qty 15

## 2015-04-19 MED ORDER — PROMETHAZINE HCL 25 MG/ML IJ SOLN
25.0000 mg | Freq: Once | INTRAMUSCULAR | Status: AC
  Administered 2015-04-19: 25 mg via INTRAVENOUS

## 2015-04-19 NOTE — Discharge Instructions (Signed)
EGD Discharge instructions Please read the instructions outlined below and refer to this sheet in the next few weeks. These discharge instructions provide you with general information on caring for yourself after you leave the hospital. Your doctor may also give you specific instructions. While your treatment has been planned according to the most current medical practices available, unavoidable complications occasionally occur. If you have any problems or questions after discharge, please call your doctor. ACTIVITY  You may resume your regular activity but move at a slower pace for the next 24 hours.   Take frequent rest periods for the next 24 hours.   Walking will help expel (get rid of) the air and reduce the bloated feeling in your abdomen.   No driving for 24 hours (because of the anesthesia (medicine) used during the test).   You may shower.   Do not sign any important legal documents or operate any machinery for 24 hours (because of the anesthesia used during the test).  NUTRITION  Drink plenty of fluids.   You may resume your normal diet.   Begin with a light meal and progress to your normal diet.   Avoid alcoholic beverages for 24 hours or as instructed by your caregiver.  MEDICATIONS  You may resume your normal medications unless your caregiver tells you otherwise.  WHAT YOU CAN EXPECT TODAY  You may experience abdominal discomfort such as a feeling of fullness or gas pains.  FOLLOW-UP  Your doctor will discuss the results of your test with you.  SEEK IMMEDIATE MEDICAL ATTENTION IF ANY OF THE FOLLOWING OCCUR:  Excessive nausea (feeling sick to your stomach) and/or vomiting.   Severe abdominal pain and distention (swelling).   Trouble swallowing.   Temperature over 101 F (37.8 C).  Rectal bleeding or vomiting of blood.   I found minimal inflammation in your stomach. You likely had a viral illness or mild food poisoning. Either way, your symptoms should get  better in the next 24-48 hrs.  Continue Dexfilant 60 mg daily  Continue Carafate  Advance diet slowly over the next 24 hours call within the next 24-48 hours if not doing much better

## 2015-04-19 NOTE — H&P (View-Only) (Signed)
Referring Provider: Phillips Odor, MD Primary Care Physician:  Phillips Odor, MD  Primary GI: Dr. Gala Romney   Chief Complaint  Patient presents with  . Abdominal Pain    HPI:   Antonio Griffith is a 30 y.o. male presenting today with a history of GERD and constipation. Last EGD in Jan 2016 with erosive reflux esophagitis. Colonoscopy surveillance due in 2019.   Friday got up ready for work, got a hunger pang. Started hurting in his abdomen. Felt weak, drained, anything he ate wouldn't help. Pain in RUQ/right chest. Cardiac etiology ruled out. Gallbladder absent. Has been feeling very jittery this morning. Still has pain in epigastric area underneath sternum. Can't eat a lot. Feels full after eating an apple, ate a banana and felt really full. Gets bloated with eating. No fever or chills. Feels weak. If he doesn't take the Linzess, has constipation. Supposed to be on the 290 mcg, which worked well, but then he ran out. Had a mishap with insurance and insurance won't pay until he reaches his deductible. Has been taking the Dexilant daily. Has 2 Linzess left of the 145 mcg. Nauseated this morning. Every time would hit, nothing would come up. Feels like a balloon that won't explode. Pain is persistent. Pain is a 5/10. Ibuprofen pretty routinely, almost daily. No ETOH in 2 years. He has been taking Ibuprofen routinely.     Past Medical History  Diagnosis Date  . Chronic back pain   . Colon polyps 2015  . GERD (gastroesophageal reflux disease)   . IBS (irritable bowel syndrome)   . Hyperplastic colon polyp     Past Surgical History  Procedure Laterality Date  . Knee surgery    . Cholecystectomy  12/26/13  . Colonoscopy  11/2013    Polypectomy x 4  . Esophagogastroduodenoscopy  11/2013  . Esophagogastroduodenoscopy N/A 03/29/2014    [id number] reflux esophagitis.   . Flexible sigmoidoscopy N/A 03/29/2014    Dr.Rourk- Anal canal internal hemorrhoids, likely source of paper hematochezia.  Diminutive rectal and sigmoid polyps were removed bx= hyperplastic polyps    Current Outpatient Prescriptions  Medication Sig Dispense Refill  . dexlansoprazole (DEXILANT) 60 MG capsule Take 1 capsule (60 mg total) by mouth daily. 90 capsule 3  . Linaclotide (LINZESS) 290 MCG CAPS capsule Take 290 mcg by mouth daily.    . cetirizine-pseudoephedrine (ZYRTEC-D) 5-120 MG tablet Take 1 tablet by mouth 2 (two) times daily. (Patient not taking: Reported on 04/16/2015) 30 tablet 0  . predniSONE (DELTASONE) 10 MG tablet Take 6 tablets day one, 5 tablets day two, 4 tablets day three, 3 tablets day four, 2 tablets day five, then 1 tablet day six (Patient not taking: Reported on 04/16/2015) 21 tablet 0  . sodium chloride (OCEAN) 0.65 % SOLN nasal spray Place 1 spray into both nostrils as needed for congestion. Reported on 04/16/2015     No current facility-administered medications for this visit.    Allergies as of 04/16/2015 - Review Complete 04/16/2015  Allergen Reaction Noted  . Peanut-containing drug products Hives 11/28/2014    Family History  Problem Relation Age of Onset  . Colon cancer Neg Hx     Social History   Social History  . Marital Status: Married    Spouse Name: N/A  . Number of Children: N/A  . 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 Smoking  status: Current Every Day Smoker -- 1.00 packs/day    Types: Cigarettes    Last Attempt to Quit: 09/11/2013  . Smokeless tobacco: Never Used  . Alcohol Use: No     Comment: one a month  . Drug Use: No  . Sexual Activity: Not Asked   Other Topics Concern  . None   Social History Narrative    Review of Systems: As mentioned in HPI.   Physical Exam: BP 144/96 mmHg  Pulse 82  Temp(Src) 97.5 F (36.4 C)  Ht 5\' 9"  (1.753 m)  Wt 307 lb (139.254 kg)  BMI 45.32 kg/m2 General:   Alert and oriented. No distress noted. Pleasant and cooperative.    Head:  Normocephalic and atraumatic. Eyes:  Conjuctiva clear without scleral icterus. Mouth:  Oral mucosa pink and moist. Good dentition. No lesions. Neck:  Supple, without mass or thyromegaly. Heart:  S1, S2 present without murmurs, rubs, or gallops. Regular rate and rhythm. Abdomen:  +BS, soft, non-tender and non-distended. No rebound or guarding. No HSM or masses noted. Msk:  Symmetrical without gross deformities. Normal posture. Extremities:  Without edema. Neurologic:  Alert and  oriented x4;  grossly normal neurologically. Skin:  Intact without significant lesions or rashes. Psych:  Alert and cooperative. Normal mood and affect.  Lab Results  Component Value Date   WBC 9.2 04/16/2015   HGB 16.4 04/16/2015   HCT 47.8 04/16/2015   MCV 86.9 04/16/2015   PLT 252 04/16/2015   Lab Results  Component Value Date   ALT 77* 04/16/2015   AST 22 04/16/2015   ALKPHOS 59 04/16/2015   BILITOT 0.4 04/16/2015   Lab Results  Component Value Date   LIPASE 16 04/16/2015

## 2015-04-19 NOTE — Interval H&P Note (Signed)
History and Physical Interval Note:  04/19/2015 2:32 PM  Antonio Griffith  has presented today for surgery, with the diagnosis of DYSPEPSIA  The various methods of treatment have been discussed with the patient and family. After consideration of risks, benefits and other options for treatment, the patient has consented to  Procedure(s) with comments: ESOPHAGOGASTRODUODENOSCOPY (EGD) (N/A) - pt knows to arrive at 1:30 as a surgical intervention .  The patient's history has been reviewed, patient examined, no change in status, stable for surgery.  I have reviewed the patient's chart and labs.  Questions were answered to the patient's satisfaction.     Antonio Griffith  No change. Patient denies dysphagia. Diagnostic EGD per plan. CT yesterday reviewed The risks, benefits, limitations, alternatives and imponderables have been reviewed with the patient. Potential for esophageal dilation, biopsy, etc. have also been reviewed.  Questions have been answered. All parties agreeable.

## 2015-04-19 NOTE — Op Note (Signed)
[facility] [address] [place], [address]   ENDOSCOPY PROCEDURE REPORT  PATIENT: Antonio, Griffith  MR#: [id number] BIRTHDATE: 03-11-1985 , 29  yrs. old GENDER: male ENDOSCOPIST: R.  Garfield Cornea, MD FACP FACG REFERRED BY:  Phillips Odor, MD PROCEDURE DATE:  April 25, 2015 PROCEDURE:  EGD, diagnostic INDICATIONS:  Dyspepsia. MEDICATIONS: Versed 5 mg IV and Demerol 125 mg IV in divided doses. Xylocaine gel orally.  Phenergan 25 mg IV.  Zofran 4 mg IV. ASA CLASS:      Class II  CONSENT: The risks, benefits, limitations, alternatives and imponderables have been discussed.  The potential for biopsy, esophogeal dilation, etc. have also been reviewed.  Questions have been answered.  All parties agreeable.  Please see the history and physical in the medical record for more information.  DESCRIPTION OF PROCEDURE: After the risks benefits and alternatives of the procedure were thoroughly explained, informed consent was obtained.  The [id number] endoscope was introduced through the mouth and advanced to the second portion of the duodenum , limited by Without limitations. The instrument was slowly withdrawn as the mucosa was fully examined. Estimated blood loss is zero unless otherwise noted in this procedure report.    Normal-appearing tubular esophagus.  Stomach empty.  Couple of tiny antral erosions; otherwise, no ulcer or infiltrating process or other abnormality.  Patent pylorus.  Normal-appearing first and second portion of the duodenum.  Retroflexed views revealed no abnormalities.     The scope was then withdrawn from the patient and the procedure completed.  COMPLICATIONS: There were no immediate complications.  ENDOSCOPIC IMPRESSION: Couple of tiny gastric erosions otherwise normal EGD. I suspect a recent viral or possibly mild foodborne illness.  RECOMMENDATIONS: Continue Dexilant and Carafate. Advance diet over the next 24 hours. Call if no  improvement in the next 24-48 hours.  REPEAT EXAM:  eSigned:  R. Garfield Cornea, MD [facility] Apr 25, 2015 2:55 PM    CC:  CPT CODES: ICD CODES:  The ICD and CPT codes recommended by this software are interpretations from the data that the clinical staff has captured with the software.  The verification of the translation of this report to the ICD and CPT codes and modifiers is the sole responsibility of the health care institution and practicing physician where this report was generated.  [employer] will not be held responsible for the validity of the ICD and CPT codes included on this report.  AMA assumes no liability for data contained or not contained herein. CPT is a Designer, television/film set of the [employer].  PATIENT NAME:  Antonio, Griffith MR#: [id number]

## 2015-04-19 NOTE — Progress Notes (Signed)
cc'ed to pcp °

## 2015-04-19 NOTE — Progress Notes (Signed)
Quick Note:  Viral markers for Hep B, C, A all negative. ______

## 2015-04-24 ENCOUNTER — Encounter (HOSPITAL_COMMUNITY): Payer: Self-pay | Admitting: Internal Medicine

## 2015-05-11 ENCOUNTER — Telehealth: Payer: Self-pay | Admitting: Internal Medicine

## 2015-05-11 NOTE — Telephone Encounter (Signed)
Pt called at 1137am asking for JL. I told him that JL was on another line and could I take a  Message or transfer him to her VM. He said, No. He would just call back in a little bit.

## 2015-05-15 NOTE — Telephone Encounter (Signed)
Spoke with the pt, he is having insurance issues and wanted some samples of dexilant and linzess 272mcg. Also requested voucher for linzess. Gave him #4 boxes of dexilant and 2 boxes of linzess 290 and a voucher. He will come by tomorrow and pick it up.

## 2015-06-19 ENCOUNTER — Encounter: Payer: Self-pay | Admitting: Internal Medicine

## 2015-06-25 ENCOUNTER — Emergency Department (HOSPITAL_COMMUNITY)
Admission: EM | Admit: 2015-06-25 | Discharge: 2015-06-25 | Disposition: A | Discharge status: Home / Self Care | Payer: Self-pay | Attending: Emergency Medicine | Admitting: Emergency Medicine

## 2015-06-25 ENCOUNTER — Encounter (HOSPITAL_COMMUNITY): Payer: Self-pay

## 2015-06-25 ENCOUNTER — Emergency Department (HOSPITAL_COMMUNITY): Payer: Self-pay

## 2015-06-25 DIAGNOSIS — Z7982 Long term (current) use of aspirin: Secondary | ICD-10-CM | POA: Insufficient documentation

## 2015-06-25 DIAGNOSIS — R0602 Shortness of breath: Secondary | ICD-10-CM | POA: Insufficient documentation

## 2015-06-25 DIAGNOSIS — F17210 Nicotine dependence, cigarettes, uncomplicated: Secondary | ICD-10-CM | POA: Insufficient documentation

## 2015-06-25 DIAGNOSIS — I809 Phlebitis and thrombophlebitis of unspecified site: Secondary | ICD-10-CM

## 2015-06-25 DIAGNOSIS — Z79899 Other long term (current) drug therapy: Secondary | ICD-10-CM | POA: Insufficient documentation

## 2015-06-25 DIAGNOSIS — I8002 Phlebitis and thrombophlebitis of superficial vessels of left lower extremity: Secondary | ICD-10-CM | POA: Insufficient documentation

## 2015-06-25 DIAGNOSIS — R079 Chest pain, unspecified: Secondary | ICD-10-CM | POA: Insufficient documentation

## 2015-06-25 LAB — BASIC METABOLIC PANEL
Anion gap: 9 (ref 5–15)
BUN: 15 mg/dL (ref 6–20)
CHLORIDE: 107 mmol/L (ref 101–111)
CO2: 24 mmol/L (ref 22–32)
CREATININE: 1.04 mg/dL (ref 0.61–1.24)
Calcium: 8.8 mg/dL — ABNORMAL LOW (ref 8.9–10.3)
GFR calc non Af Amer: >60 mL/min (ref >60)
Glucose, Bld: 104 mg/dL — ABNORMAL HIGH (ref 65–99)
Potassium: 3.7 mmol/L (ref 3.5–5.1)
Sodium: 140 mmol/L (ref 135–145)

## 2015-06-25 LAB — CBC WITH DIFFERENTIAL/PLATELET
BASOS ABS: 0 10*3/uL (ref 0.0–0.1)
BASOS PCT: 0 %
Eosinophils Absolute: 0.2 10*3/uL (ref 0.0–0.7)
Eosinophils Relative: 2 %
HEMATOCRIT: 46.8 % (ref 39.0–52.0)
HEMOGLOBIN: 16.3 g/dL (ref 13.0–17.0)
LYMPHS PCT: 24 %
Lymphs Abs: 2.5 10*3/uL (ref 0.7–4.0)
MCH: 29.6 pg (ref 26.0–34.0)
MCHC: 34.8 g/dL (ref 30.0–36.0)
MCV: 85.1 fL (ref 78.0–100.0)
Monocytes Absolute: 0.6 10*3/uL (ref 0.1–1.0)
Monocytes Relative: 5 %
NEUTROS ABS: 7.4 10*3/uL (ref 1.7–7.7)
NEUTROS PCT: 69 %
Platelets: 227 10*3/uL (ref 150–400)
RBC: 5.5 MIL/uL (ref 4.22–5.81)
RDW: 12.7 % (ref 11.5–15.5)
WBC: 10.7 10*3/uL — AB (ref 4.0–10.5)

## 2015-06-25 MED ORDER — IOHEXOL 350 MG/ML SOLN
120.0000 mL | Freq: Once | INTRAVENOUS | Status: AC | PRN
  Administered 2015-06-25: 120 mL via INTRAVENOUS

## 2015-06-25 MED ORDER — ENOXAPARIN SODIUM 150 MG/ML ~~LOC~~ SOLN
1.0000 mg/kg | Freq: Once | SUBCUTANEOUS | Status: AC
  Administered 2015-06-25: 140 mg via SUBCUTANEOUS
  Filled 2015-06-25: qty 1

## 2015-06-25 NOTE — ED Notes (Signed)
Calf measurements:  R calf was 20", L calf was 20 1/4"

## 2015-06-25 NOTE — ED Notes (Signed)
Patient states he is currently being treated by a vein specialist for blood clot in left lower leg. Patient states increase in pain and swelling to left leg. Patient ambulatory into triage, and presents with compression stocking to left lower leg.

## 2015-06-25 NOTE — ED Notes (Signed)
Pt has palpable pedal pulse in left foot.

## 2015-06-25 NOTE — Discharge Instructions (Signed)
Phlebitis  Phlebitis is soreness and swelling (inflammation) of a vein. This can occur in your arms, legs, or torso (trunk), as well as deeper inside your body. Phlebitis is usually not serious when it occurs close to the surface of the body. However, it can cause serious problems when it occurs in a vein deeper inside the body.  CAUSES   Phlebitis can be triggered by various things, including:    Reduced blood flow through your veins. This can happen with:    Bed rest over a long period.    Long-distance travel.    Injury.    Surgery.    Being overweight (obese) or pregnant.   Having an IV tube put in the vein and getting certain medicines through the vein.   Cancer and cancer treatment.   Use of illegal drugs taken through the vein.   Inflammatory diseases.   Inherited (genetic) diseases that increase the risk of blood clots.   Hormone therapy, such as birth control pills.  SIGNS AND SYMPTOMS    Red, tender, swollen, and painful area on your skin. Usually, the area will be long and narrow.   Firmness along the center of the affected area. This can indicate that a blood clot has formed.   Low-grade fever.  DIAGNOSIS   A health care provider can usually diagnose phlebitis by examining the affected area and asking about your symptoms. To check for infection or blood clots, your health care provider may order blood tests or an ultrasound exam of the area. Blood tests and your family history may also indicate if you have an underlying genetic disease that causes blood clots. Occasionally, a piece of tissue is taken from the body (biopsy sample) if an unusual cause of phlebitis is suspected.  TREATMENT   Treatment will vary depending on the severity of the condition and the area of the body affected. Treatment may include:   Use of a warm compress or heating pad.   Use of compression stockings or bandages.   Anti-inflammatory medicines.   Removal of any IV tube that may be causing the problem.   Medicines  that kill germs (antibiotics) if an infection is present.   Blood-thinning medicines if a blood clot is suspected or present.   In rare cases, surgery may be needed to remove damaged sections of vein.  HOME CARE INSTRUCTIONS    Only take over-the-counter or prescription medicines as directed by your health care provider. Take all medicines exactly as prescribed.   Raise (elevate) the affected area above the level of your heart as directed by your health care provider.   Apply a warm compress or heating pad to the affected area as directed by your health care provider. Do not sleep with the heating pad.   Use compression stockings or bandages as directed. These will speed healing and prevent the condition from coming back.   If you are on blood thinners:    Get follow-up blood tests as directed by your health care provider.    Check with your health care provider before using any new medicines.    Carry a medical alert card or wear your medical alert jewelry to show that you are on blood thinners.   For phlebitis in the legs:    Avoid prolonged standing or bed rest.    Keep your legs moving. Raise your legs when sitting or lying.   Do not smoke.   Women, particularly those over the age of 35, should consider   the risks and benefits of taking the contraceptive pill. This kind of hormone treatment can increase your risk for blood clots.   Follow up with your health care provider as directed.  SEEK MEDICAL CARE IF:    You have unusual bruising or any bleeding problems.   Your swelling or pain in the affected area is not improving.   You are on anti-inflammatory medicine, and you develop belly (abdominal) pain.  SEEK IMMEDIATE MEDICAL CARE IF:    You have a sudden onset of chest pain or difficulty breathing.   You have a fever or persistent symptoms for more than 2-3 days.   You have a fever and your symptoms suddenly get worse.  MAKE SURE YOU:   Understand these instructions.   Will watch your  condition.   Will get help right away if you are not doing well or get worse.     This information is not intended to replace advice given to you by your health care provider. Make sure you discuss any questions you have with your health care provider.     Document Released: 02/11/2001 Document Revised: 12/08/2012 Document Reviewed: 10/25/2012  Elsevier Interactive Patient Education 2016 [employer]

## 2015-06-25 NOTE — ED Provider Notes (Signed)
CSN: [id number]     Arrival date & time 06/25/15  49 History   First MD Initiated Contact with Patient 06/25/15 1818     Chief Complaint  Patient presents with  . Leg Pain      Patient is a 30 y.o. male presenting with leg pain. The history is provided by the patient.  Leg Pain Associated symptoms: no back pain and no fever   Patient has pain in his left lower leg. Has a chronic history of varicose veins. Reportedly was seen around a week ago at [facility] and diagnosed with clots in the superficial varicose veins. Per patient they were unable to image the deeper veins. Now began to have more pain over the upper middle calf. States the veins have become more painful and firm. Slight redness. States it hurts to even a close on him. States today he did just kind of feel bad all over. Slight chest pain shortness of breath. May have been a little sweaty. Feels a little off but otherwise better now. States he was told things got worse to go back to see them or come to the ER. States family members have had varicose veins in the past but no known clots. He is a smoker.  Past Medical History  Diagnosis Date  . Chronic back pain   . Colon polyps 2015  . GERD (gastroesophageal reflux disease)   . IBS (irritable bowel syndrome)   . Hyperplastic colon polyp    Past Surgical History  Procedure Laterality Date  . Knee surgery    . Cholecystectomy  12/26/13  . Colonoscopy  11/2013    Polypectomy x 4  . Esophagogastroduodenoscopy  11/2013  . Esophagogastroduodenoscopy N/A 03/29/2014    [id number] reflux esophagitis.   . Flexible sigmoidoscopy N/A 03/29/2014    Dr.Rourk- Anal canal internal hemorrhoids, likely source of paper hematochezia. Diminutive rectal and sigmoid polyps were removed bx= hyperplastic polyps  . Esophagogastroduodenoscopy N/A 04/19/2015    Procedure: ESOPHAGOGASTRODUODENOSCOPY (EGD);  Surgeon: Daneil Dolin, MD;  Location: [facility];  Service: Endoscopy;  Laterality: N/A;   pt knows to arrive at 1:30   Family History  Problem Relation Age of Onset  . Colon cancer Neg Hx    Social History  Substance Use Topics  . Smoking status: Current Every Day Smoker -- 1.00 packs/day    Types: Cigarettes    Last Attempt to Quit: 09/11/2013  . Smokeless tobacco: Never Used  . Alcohol Use: No     Comment: one a month    Review of Systems  Constitutional: Negative for fever, activity change and appetite change.  Eyes: Negative for pain.  Respiratory: Positive for shortness of breath. Negative for chest tightness.   Cardiovascular: Positive for chest pain. Negative for leg swelling.  Gastrointestinal: Negative for nausea, vomiting, abdominal pain and diarrhea.  Genitourinary: Negative for flank pain.  Musculoskeletal: Negative for back pain and neck stiffness.  Skin: Negative for rash.  Neurological: Negative for weakness, numbness and headaches.  Psychiatric/Behavioral: Negative for behavioral problems.      Allergies  Peanut-containing drug products  Home Medications   Prior to Admission medications   Medication Sig Start Date End Date Taking? Authorizing Provider  aspirin 325 MG tablet Take 325 mg by mouth daily.   Yes Historical Provider, MD  dexlansoprazole (DEXILANT) 60 MG capsule Take 1 capsule (60 mg total) by mouth daily. 12/19/14  Yes Mahala Menghini, PA-C  Linaclotide (LINZESS) 290 MCG CAPS capsule Take 290 mcg  by mouth daily.   Yes Historical Provider, MD  sodium chloride (OCEAN) 0.65 % SOLN nasal spray Place 1 spray into both nostrils as needed for congestion. Reported on 04/16/2015   Yes Historical Provider, MD   BP 179/113 mmHg  Pulse 68  Temp(Src) 98.1 F (36.7 C) (Oral)  Resp 18  Ht 5\' 9"  (1.753 m)  Wt 307 lb (139.254 kg)  BMI 45.32 kg/m2  SpO2 99% Physical Exam  Constitutional: He appears well-developed.  HENT:  Head: Atraumatic.  Neck: Neck supple.  Cardiovascular: Normal rate.   Pulmonary/Chest: Effort normal.  Abdominal:  Soft.  Musculoskeletal: He exhibits tenderness.  Some firmness of the varicose veins of the proximal left lower leg. Slight erythema. No edema of lower legs. Dorsalis pedis pulse intact bilaterally.  Neurological: He is alert.  Skin: Skin is warm.    ED Course  Procedures (including critical care time) Labs Review Labs Reviewed  CBC WITH DIFFERENTIAL/PLATELET - Abnormal; Notable for the following:    WBC 10.7 (*)    All other components within normal limits  BASIC METABOLIC PANEL - Abnormal; Notable for the following:    Glucose, Bld 104 (*)    Calcium 8.8 (*)    All other components within normal limits    Imaging Review Ct Angio Chest Pe W/cm &/or Wo Cm  06/25/2015  CLINICAL DATA:  Short of breath today EXAM: CT ANGIOGRAPHY CHEST WITH CONTRAST TECHNIQUE: Multidetector CT imaging of the chest was performed using the standard protocol during bolus administration of intravenous contrast. Multiplanar CT image reconstructions and MIPs were obtained to evaluate the vascular anatomy. CONTRAST:  128mL OMNIPAQUE IOHEXOL 350 MG/ML SOLN COMPARISON:  None. FINDINGS: There are no filling defects in the pulmonary arterial tree to suggest acute pulmonary thromboembolism. 9 mm prevascular node. 9 mm right hilar node. 11 mm left hilar node. No pericardial effusion. Prominent mediastinal fat. No pneumothorax. No pleural effusion. Lungs are under aerated and grossly clear. Degenerative disc disease at T10-T11 and T11-T12 results an element of spinal stenosis. Congenital short pedicles contribute. Diffuse hepatic steatosis.  Gynecomastia. Review of the MIP images confirms the above findings. IMPRESSION: No evidence of acute pulmonary thromboembolism Borderline hilar and mediastinal adenopathy. This may represent a chronic finding, however inflammatory etiology such as sarcoidosis or mycobacterial infection, are not excluded. Neoplasm is not entirely excluded. Consider three-month follow-up to ensure stability  and/or resolution of these findings. Electronically Signed   By: Marybelle Killings M.D.   On: 06/25/2015 19:43   I have personally reviewed and evaluated these images and lab results as part of my medical decision-making.   EKG Interpretation None      MDM   Final diagnoses:  Superficial thrombophlebitis    Patient with leg pain. Likely superficial thrombophlebitis but could've progressed deeper. Chest CT did not show pulmonary embolism. Patient was informed of the adenopathy that will need following.    Davonna Belling, MD 06/26/15 [phone]

## 2015-06-25 NOTE — ED Notes (Signed)
MD Pickering at bedside.  

## 2015-06-25 NOTE — ED Notes (Signed)
Patient states he takes 325mg  ASA daily.

## 2015-06-26 ENCOUNTER — Other Ambulatory Visit (HOSPITAL_COMMUNITY): Payer: Self-pay | Admitting: Emergency Medicine

## 2015-06-26 ENCOUNTER — Ambulatory Visit (HOSPITAL_COMMUNITY)
Admit: 2015-06-26 | Discharge: 2015-06-26 | Disposition: A | Discharge status: Home / Self Care | Payer: MEDICAID | Attending: Emergency Medicine | Admitting: Emergency Medicine

## 2015-06-26 DIAGNOSIS — M7989 Other specified soft tissue disorders: Secondary | ICD-10-CM | POA: Insufficient documentation

## 2015-06-26 DIAGNOSIS — M79605 Pain in left leg: Secondary | ICD-10-CM

## 2015-06-26 DIAGNOSIS — I8392 Asymptomatic varicose veins of left lower extremity: Secondary | ICD-10-CM | POA: Insufficient documentation

## 2015-06-26 DIAGNOSIS — I8002 Phlebitis and thrombophlebitis of superficial vessels of left lower extremity: Secondary | ICD-10-CM | POA: Insufficient documentation

## 2015-08-06 ENCOUNTER — Telehealth: Payer: Self-pay

## 2015-08-06 NOTE — Telephone Encounter (Signed)
Pt called- he quit taking his dexilant about a month ago. He said it was making him hungry all day and and then when he would eat, he would hunger pains. He started taking a probiotic and he is feeling better and not having any reflux symptoms. He wants to know what he can have on hand in case he does have reflux symptoms. Spoke with AS, she said pepcid or zantac were both ok for him. Pt is aware.  Pt also requested samples of linzess 288mcg. Left 2 boxes at the front desk with a copay card.

## 2015-08-07 NOTE — Telephone Encounter (Signed)
Agree 

## 2015-09-10 ENCOUNTER — Telehealth: Payer: Self-pay | Admitting: Internal Medicine

## 2015-09-10 ENCOUNTER — Other Ambulatory Visit: Payer: Self-pay

## 2015-09-10 DIAGNOSIS — R1011 Right upper quadrant pain: Secondary | ICD-10-CM

## 2015-09-10 DIAGNOSIS — K760 Fatty (change of) liver, not elsewhere classified: Secondary | ICD-10-CM

## 2015-09-10 NOTE — Telephone Encounter (Signed)
RECALL FOR CT  °

## 2015-09-10 NOTE — Telephone Encounter (Signed)
Tried to call with no answer  

## 2015-09-10 NOTE — Telephone Encounter (Signed)
Letter mailed with appointment date and time

## 2015-09-14 ENCOUNTER — Telehealth: Payer: Self-pay | Admitting: Internal Medicine

## 2015-09-14 NOTE — Telephone Encounter (Signed)
Pt called to reschedule his CT for Tuesday 7/18. He can not get the time off work to go. I gave him the number to [facility] radiology to call to reschedule

## 2015-09-14 NOTE — Telephone Encounter (Signed)
noted 

## 2015-09-18 ENCOUNTER — Ambulatory Visit (HOSPITAL_COMMUNITY): Payer: MEDICAID

## 2015-09-25 ENCOUNTER — Other Ambulatory Visit: Payer: Self-pay

## 2015-09-25 DIAGNOSIS — R1011 Right upper quadrant pain: Secondary | ICD-10-CM

## 2015-09-25 NOTE — Telephone Encounter (Signed)
PA# FOR CT A/P WITH CONTRAST- FO:[id number]

## 2015-09-26 ENCOUNTER — Ambulatory Visit (HOSPITAL_COMMUNITY): Payer: 59

## 2015-09-26 ENCOUNTER — Ambulatory Visit (HOSPITAL_COMMUNITY)
Admission: RE | Admit: 2015-09-26 | Discharge: 2015-09-26 | Disposition: A | Discharge status: Home / Self Care | Payer: 59 | Source: Ambulatory Visit | Attending: Gastroenterology | Admitting: Gastroenterology

## 2015-09-26 DIAGNOSIS — R1011 Right upper quadrant pain: Secondary | ICD-10-CM | POA: Insufficient documentation

## 2015-09-26 DIAGNOSIS — K760 Fatty (change of) liver, not elsewhere classified: Secondary | ICD-10-CM | POA: Diagnosis not present

## 2015-09-26 DIAGNOSIS — Z9049 Acquired absence of other specified parts of digestive tract: Secondary | ICD-10-CM | POA: Diagnosis not present

## 2015-09-26 MED ORDER — IOPAMIDOL (ISOVUE-300) INJECTION 61%
100.0000 mL | Freq: Once | INTRAVENOUS | Status: AC | PRN
  Administered 2015-09-26: 100 mL via INTRAVENOUS

## 2015-10-04 ENCOUNTER — Other Ambulatory Visit: Payer: Self-pay | Admitting: Family Medicine

## 2015-10-04 ENCOUNTER — Encounter: Payer: Self-pay | Admitting: Family Medicine

## 2015-10-04 ENCOUNTER — Ambulatory Visit (INDEPENDENT_AMBULATORY_CARE_PROVIDER_SITE_OTHER): Payer: 59 | Admitting: Family Medicine

## 2015-10-04 VITALS — BP 150/98 | HR 86 | Resp 12 | Ht 69.0 in | Wt 312.2 lb

## 2015-10-04 DIAGNOSIS — R208 Other disturbances of skin sensation: Secondary | ICD-10-CM

## 2015-10-04 DIAGNOSIS — M5417 Radiculopathy, lumbosacral region: Secondary | ICD-10-CM | POA: Diagnosis not present

## 2015-10-04 DIAGNOSIS — I10 Essential (primary) hypertension: Secondary | ICD-10-CM | POA: Diagnosis not present

## 2015-10-04 DIAGNOSIS — L301 Dyshidrosis [pompholyx]: Secondary | ICD-10-CM

## 2015-10-04 DIAGNOSIS — S2096XA Insect bite (nonvenomous) of unspecified parts of thorax, initial encounter: Secondary | ICD-10-CM | POA: Diagnosis not present

## 2015-10-04 DIAGNOSIS — R200 Anesthesia of skin: Secondary | ICD-10-CM

## 2015-10-04 DIAGNOSIS — W57XXXA Bitten or stung by nonvenomous insect and other nonvenomous arthropods, initial encounter: Secondary | ICD-10-CM | POA: Diagnosis not present

## 2015-10-04 DIAGNOSIS — M5416 Radiculopathy, lumbar region: Secondary | ICD-10-CM

## 2015-10-04 DIAGNOSIS — Z6841 Body Mass Index (BMI) 40.0 and over, adult: Secondary | ICD-10-CM

## 2015-10-04 DIAGNOSIS — K589 Irritable bowel syndrome without diarrhea: Secondary | ICD-10-CM | POA: Insufficient documentation

## 2015-10-04 LAB — COMPREHENSIVE METABOLIC PANEL
ALBUMIN: 4 g/dL (ref 3.5–5.2)
ALT: 36 U/L (ref 0–53)
AST: 22 U/L (ref 0–37)
Alkaline Phosphatase: 54 U/L (ref 39–117)
BUN: 14 mg/dL (ref 6–23)
CALCIUM: 9.2 mg/dL (ref 8.4–10.5)
CHLORIDE: 106 meq/L (ref 96–112)
CO2: 28 mEq/L (ref 19–32)
Creatinine, Ser: 1 mg/dL (ref 0.40–1.50)
GFR: 92.86 mL/min (ref >60.00)
Glucose, Bld: 104 mg/dL — ABNORMAL HIGH (ref 70–99)
POTASSIUM: 3.9 meq/L (ref 3.5–5.1)
SODIUM: 141 meq/L (ref 135–145)
Total Bilirubin: 0.4 mg/dL (ref 0.2–1.2)
Total Protein: 7.2 g/dL (ref 6.0–8.3)

## 2015-10-04 LAB — HEMOGLOBIN A1C: Hgb A1c MFr Bld: 5.6 % (ref 4.6–6.5)

## 2015-10-04 LAB — VITAMIN B12: VITAMIN B 12: 248 pg/mL (ref 211–911)

## 2015-10-04 MED ORDER — LISINOPRIL-HYDROCHLOROTHIAZIDE 10-12.5 MG PO TABS
1.0000 | ORAL_TABLET | Freq: Every day | ORAL | 3 refills | Status: DC
Start: 2015-10-04 — End: 2016-01-02

## 2015-10-04 MED ORDER — CLOBETASOL PROPIONATE 0.05 % EX CREA: 1.0000 "application " | TOPICAL_CREAM | Freq: Two times a day (BID) | CUTANEOUS | 1 refills | Status: DC

## 2015-10-04 MED ORDER — DOXYCYCLINE HYCLATE 100 MG PO TABS: 200.0000 mg | ORAL_TABLET | Freq: Once | ORAL | 0 refills | Status: AC

## 2015-10-04 NOTE — Patient Instructions (Addendum)
A few things to remember from today's visit:   Tick bite of thoracic region, initial encounter - Plan: doxycycline (VIBRA-TABS) 100 MG tablet, B. burgdorfi Antibody  IBS (irritable bowel syndrome)  Lumbar back pain with radiculopathy affecting right lower extremity - Plan: MR Lumbar Spine Wo Contrast  Numbness of both lower extremities - Plan: Hemoglobin A1c, Vitamin B12, Comprehensive metabolic panel, B. burgdorfi Antibody, MR Lumbar Spine Wo Contrast  Essential hypertension, benign - Plan: lisinopril-hydrochlorothiazide (PRINZIDE,ZESTORETIC) 10-12.5 MG tablet, Basic Metabolic Panel  BMI [id number], adult (HCC)  Dyshidrotic foot dermatitis - Plan: clobetasol cream (TEMOVATE) 0.05 %  What are some tips for weight loss? People become overweight for many reasons. Weight issues can run in families. They can be caused by unhealthy behaviors and a person's environment. Certain health problems and medicines can also lead to weight gain. There are some simple things you can do to reach and maintain a healthy weight:  Eat 500 fewer calories per day than your body needs to maintain your weight. Women should aim for no more than 1,200 to 1,500 calories per day. Men should aim for 1,500 to 1,800 calories per day. Avoid sweet drinks. These include regular soft drinks, fruit juices, fruit drinks, energy drinks, sweetened iced tea, and flavored milk. Avoid fast foods. Fast foods such as french fries, hamburgers, chicken nuggets, and pizza are high in calories and can cause weight gain. Eat a healthy breakfast. People who skip breakfast tend to weigh more. Don't watch more than two hours of television per day. Chew sugar-free gum between meals to cut down on snacking. Avoid grocery shopping when you're hungry. Pack a healthy lunch instead of eating out to control what and how much you eat. Eat a lot of fruits and vegetables. Aim for about 2 cups of fruit and 2 to 3 cups of vegetables per day. Aim for  150 minutes per week of moderate-intensity exercise (such as brisk walking), or 75 minutes per week of vigorous exercise (such as jogging or running). Be more active. Small changes in physical activity can easily be added to your daily routine. For example, take the stairs instead of the elevator. Take a walk with your family. A daily walk is a great way to get exercise and to catch up on the day's events.  Continue decreasing smoking.  Blood pressure goal for most people is less than 140/90.  Elevated blood pressure increases the risk of strokes, heart and kidney disease, and eye problems. Regular physical activity and a healthy diet (DASH diet) usually help. Low salt diet. Take medications as instructed. Caution with some over the counter medications as cold medications, dietary products (for weight loss), and Ibuprofen or Aleve (frequent use);all these medications could cause elevation of blood pressure.     Please be sure medication list is accurate. If a new problem present, please set up appointment sooner than planned today.

## 2015-10-04 NOTE — Progress Notes (Signed)
HPI:   [name] is a 30 y.o. male, who is here today to establish care with me.  Former PCP: Did not had one. Last preventive routine visit: at age 84.  Hx of GERD he was on Dexilant 60 mg but he was still having symptoms and having "hungry pains",so discontinued and started daily probiotic,which has has helped.  Last time he saw GI was about 2-3 weeks ago, instructed to follow up as needed.  Also history of IBS constipation, currently he is on Linzess. He reports having colonoscopies in the past and EGD.  According to patient, he has also been evaluated for OSA, he had a sleep study a few months ago but he is not aware of the results. He denies loud snoring or witnessed apnea.  Hx of superficial thrombophlebitis LLE on 06/25/2015, when he presented to the ER becasue LLE pain. According to patient he was initially treated conservatively, in June 2017 thrombectomy was done because worsening symptoms.   Concerns today: Tick bite, numbness LE,rash on feet.  Tick bite:  2nd weeks on June 2017 tick bites (2 places same tick), tick was on for about 2 hours. One bite healed and the other lesion still hurting, burning , and itching.  Tick was Imbeded, wife "digged it out",not engorged.  2 weeks after tick bite constant LE numbness and burning, whole legs. No focal weakness.  Hx of lower back pain with radiation to RLE and right knee pain, stable.   Numbness:  Hx of lower back pain, R>L, radiated to RLE. Chiropractor treatment usually helps after 2 days but this time did not help.   Now he is having costant bilateral and numbness, he denies any weakness or saddle anesthesia. No recent trauma, no fever, skin changes on area of lower back, or urine/bowel incontinence.  According to patient, numbness on left LE started about 2 weeks after thrombectomy LLE; he states that he was told it was related to medication given and could last 6 months.  + Smoker, decreasing  cig/day.    BP elevated today, no prior Hx of HTN. According to patient, his BP has been elevated for a while, he use to check BP at the grocery store. Denies severe/frequent headache, visual changes, chest pain, dyspnea, palpitation, claudication, focal weakness, or edema.   Lab Results  Component Value Date   CREATININE 1.04 06/25/2015   BUN 15 06/25/2015   NA 140 06/25/2015   K 3.7 06/25/2015   CL 107 06/25/2015   CO2 24 06/25/2015    Foot rash: Very pruritic rash on the plantar area, bilateral. He has had this rash for about 1-2 years, according to patient he was recently treated by podiatrist with oral antibiotic for "athlete's foot"  , helped initially but not resolved and seems to be getting worse again. "Blisters" on soles, occasionally burning, no lesions in between toes. He has not identified trigger factor.   He does not follow a healthy diet. He does not exercise.  Lives with wife and 50 months son.   Review of Systems  Constitutional: Negative for activity change, appetite change, fatigue, fever and unexpected weight change.  HENT: Negative for dental problem, facial swelling, nosebleeds, sore throat and trouble swallowing.   Eyes: Negative for redness and visual disturbance.  Respiratory: Negative for cough, shortness of breath and wheezing.        + Smoker.  Cardiovascular: Negative for chest pain, palpitations and leg swelling.  Gastrointestinal: Negative for abdominal  pain, nausea and vomiting.       No changes in bowel habits  Endocrine: Negative for cold intolerance, heat intolerance, polydipsia, polyphagia and polyuria.  Genitourinary: Negative for decreased urine volume, dysuria and hematuria.  Musculoskeletal: Positive for arthralgias and back pain. Negative for gait problem.  Skin: Positive for rash. Negative for color change and wound.  Neurological: Positive for numbness. Negative for dizziness, seizures, syncope, weakness and headaches.    Hematological: Negative for adenopathy. Does not bruise/bleed easily.  Psychiatric/Behavioral: Negative for confusion and sleep disturbance. The patient is not nervous/anxious.       Current Outpatient Prescriptions on File Prior to Visit  Medication Sig Dispense Refill  . Linaclotide (LINZESS) 290 MCG CAPS capsule Take 290 mcg by mouth daily.     No current facility-administered medications on file prior to visit.      Past Medical History:  Diagnosis Date  . Blood in stool   . Chronic back pain   . Colon polyps 2015  . GERD (gastroesophageal reflux disease)   . Hyperplastic colon polyp   . IBS (irritable bowel syndrome)   . Kidney stones    Allergies  Allergen Reactions  . Peanut-Containing Drug Products Hives    Family History  Problem Relation Age of Onset  . Hypertension Mother   . Hypertension Father   . Colon cancer Neg Hx     Social History   Social History  . Marital status: Married    Spouse name: N/A  . Number of children: N/A  . Years of education: N/A   Occupational History  . 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 Smoking status: Current Every Day Smoker    Packs/day: 1.00    Types: Cigarettes    Last attempt to quit: 09/11/2013  . Smokeless tobacco: Never Used  . Alcohol use No     Comment: one a month  . Drug use: No  . Sexual activity: Not Asked   Other Topics Concern  . None   Social History Narrative  . None    Vitals:   10/04/15 0746  BP: (!) 150/98  Pulse: 86  Resp: 12    Body mass index is 46.11 kg/m.  O2 at RA 98%    Physical Exam  Nursing note and vitals reviewed. Constitutional: He is oriented to person, place, and time. He appears well-developed. No distress.  HENT:  Head: Atraumatic.  Mouth/Throat: Oropharynx is clear and moist and mucous membranes are normal.  Eyes: Conjunctivae and EOM are normal. Pupils are equal, round, and reactive to light.  Neck: No tracheal deviation  present. No thyromegaly present.  Cardiovascular: Normal rate and regular rhythm.   No murmur heard. Pulses:      Dorsalis pedis pulses are 2+ on the right side, and 2+ on the left side.  Respiratory: Effort normal and breath sounds normal. No respiratory distress.  GI: Soft. He exhibits no mass. There is no hepatomegaly. There is no tenderness.  Musculoskeletal: He exhibits no edema or tenderness.  No tenderness upon palpation of paraspinal muscles thoracic and lumbar, bilateral.  Lymphadenopathy:    He has no cervical adenopathy.       Right: No supraclavicular adenopathy present.       Left: No supraclavicular adenopathy present.  Neurological: He is alert and oriented to person, place, and time. He has normal strength. A sensory deficit (mildly decreased monofilament LLE, distally, madial, anterior,and lateral.) is present.  Coordination and gait normal.  Reflex Scores:      Patellar reflexes are 2+ on the right side and 2+ on the left side.      Achilles reflexes are 2+ on the right side and 2+ on the left side. SLR negative bilateral.  Skin: Skin is warm. Rash noted. No petechiae noted. No erythema.     Plantar upper and medial aspect , mainly at the level of first MTP joint callus, a few microvesicular lesions, no tender, no erythema, bilateral. Tattoos on extremities and upper chest. Areas were tick bite occured (left lateral trunk)with no erythema, post inflammatory hyperpigmentation (1 cm), no induration.  Psychiatric: He has a normal mood and affect.  Well groomed, good eye contact.      ASSESSMENT AND PLAN:   Lab Results  Component Value Date   HGBA1C 5.6 10/04/2015   Lab Results  Component Value Date   VITAMINB12 248 10/04/2015     Chemistry      Component Value Date/Time   NA 141 10/04/2015 0908   K 3.9 10/04/2015 0908   CL 106 10/04/2015 0908   CO2 28 10/04/2015 0908   BUN 14 10/04/2015 0908   CREATININE 1.00 10/04/2015 0908   CREATININE 0.88 04/16/2015  1305      Component Value Date/Time   CALCIUM 9.2 10/04/2015 0908   ALKPHOS 54 10/04/2015 0908   AST 22 10/04/2015 0908   ALT 36 10/04/2015 0908   BILITOT 0.4 10/04/2015 0908       Antonio Griffith was seen today for new patient (initial visit).  Diagnoses and all orders for this visit:  Tick bite of thoracic region, initial encounter  Bite sites are healing well, no suspicious for erythema migrans. I recommend prophylactic Doxycycline dose. Continue monitoring. Further recommendations will be given according to lab results.  -     doxycycline (VIBRA-TABS) 100 MG tablet; Take 2 tablets (200 mg total) by mouth once. -     B. burgdorfi Antibody  Numbness of both lower extremities  Possible causes discussed. He is concerned about this being related with the tick bite. Further recommendations would be given according to lab results.  -     Hemoglobin A1c -     Vitamin B12 -     Comprehensive metabolic panel -     B. burgdorfi Antibody -     MR Lumbar Spine Wo Contrast; Future  Lumbar back pain with radiculopathy affecting right lower extremity  Because he seems to be getting worse, he has not had MRI in the past, lumbar MRI will be arranged. Instructed about warning signs. We may consider Cymbalta, Gabapentin, Lyrica next office visit.  -     MR Lumbar Spine Wo Contrast; Future  Essential hypertension, benign  Still elevated and reporting elevated BP's when checked before, so pharmacologic treatment recommended. Some side effects discussed. BMP  7-10 days after Lisinopril-HCTZ started. Monitor BP at home. Possible complications of elevated BP discussed. Annual eye examination. F/U in 2 months.  -     lisinopril-hydrochlorothiazide (PRINZIDE,ZESTORETIC) 10-12.5 MG tablet; Take 1 tablet by mouth daily. -     Basic Metabolic Panel; Future  Dyshidrotic foot dermatitis  Bilateral foot. Some side effects of topical steroid discussed. Clobetasol cream twice per day for up to  2 weeks and then as needed. Follow-up as needed.  -     clobetasol cream (TEMOVATE) 0.05 %; Apply 1 application topically 2 (two) times daily. 14 days then as needed  BMI 45.0-49.9, adult (  [place])  We discussed benefits of wt loss as well as adverse effects of obesity. Consistency with healthy diet and physical activity recommended. Weight Watchers is a good option as well as daily brisk walking for 15-30 min as tolerated.             Londen Bok G. Martinique, MD  [facility]. [place] office.

## 2015-10-05 LAB — LYME AB/WESTERN BLOT REFLEX: B burgdorferi Ab IgG+IgM: <0.9 {index}

## 2015-10-13 ENCOUNTER — Ambulatory Visit
Admission: RE | Admit: 2015-10-13 | Discharge: 2015-10-13 | Disposition: A | Discharge status: Home / Self Care | Payer: 59 | Source: Ambulatory Visit | Attending: Family Medicine | Admitting: Family Medicine

## 2015-10-13 DIAGNOSIS — R200 Anesthesia of skin: Secondary | ICD-10-CM

## 2015-10-13 DIAGNOSIS — M5416 Radiculopathy, lumbar region: Secondary | ICD-10-CM

## 2015-10-15 ENCOUNTER — Other Ambulatory Visit (INDEPENDENT_AMBULATORY_CARE_PROVIDER_SITE_OTHER): Payer: 59

## 2015-10-15 DIAGNOSIS — I10 Essential (primary) hypertension: Secondary | ICD-10-CM | POA: Diagnosis not present

## 2015-10-15 LAB — BASIC METABOLIC PANEL
BUN: 13 mg/dL (ref 6–23)
CALCIUM: 9.3 mg/dL (ref 8.4–10.5)
CO2: 26 mEq/L (ref 19–32)
Chloride: 107 mEq/L (ref 96–112)
Creatinine, Ser: 0.89 mg/dL (ref 0.40–1.50)
GFR: 106.21 mL/min (ref >60.00)
Glucose, Bld: 87 mg/dL (ref 70–99)
Potassium: 4.5 mEq/L (ref 3.5–5.1)
SODIUM: 140 meq/L (ref 135–145)

## 2015-10-23 NOTE — Progress Notes (Signed)
RUQ adenopathy resolved. No further evaluation needed.

## 2015-12-31 ENCOUNTER — Other Ambulatory Visit: Payer: Self-pay | Admitting: Neurosurgery

## 2016-01-08 ENCOUNTER — Encounter (HOSPITAL_COMMUNITY): Payer: Self-pay | Admitting: *Deleted

## 2016-01-08 NOTE — Progress Notes (Signed)
Antonio Griffith reports that he was started on Zestoretic for elevated blood pressure. Patient stated that after he had been on the medication for about a week that he started having severe abdominal pain. Patient reports that he was seen in [facility] ED and test were preformed and no problem was found, the only thing that was notable was that it started after starting blood pressure medication, so he stopped it.  Patient's PCP is Dr Martinique at [facility], patient has not notified her. Patient states that he has been to [facility] hospital since and no one said anything about blood pressure being elevated. I requested records from [facility].

## 2016-01-09 ENCOUNTER — Encounter (HOSPITAL_COMMUNITY)
Admission: RE | Disposition: A | Discharge status: Home / Self Care | Payer: Self-pay | Source: Ambulatory Visit | Attending: Neurosurgery

## 2016-01-09 ENCOUNTER — Inpatient Hospital Stay (HOSPITAL_COMMUNITY): Payer: Worker's Compensation

## 2016-01-09 ENCOUNTER — Inpatient Hospital Stay (HOSPITAL_COMMUNITY): Payer: Worker's Compensation | Admitting: Certified Registered Nurse Anesthetist

## 2016-01-09 ENCOUNTER — Observation Stay (HOSPITAL_COMMUNITY)
Admission: RE | Admit: 2016-01-09 | Discharge: 2016-01-10 | Disposition: A | Discharge status: Home / Self Care | Payer: Worker's Compensation | Source: Ambulatory Visit | Attending: Neurosurgery | Admitting: Neurosurgery

## 2016-01-09 ENCOUNTER — Encounter (HOSPITAL_COMMUNITY): Payer: Self-pay | Admitting: *Deleted

## 2016-01-09 DIAGNOSIS — F17210 Nicotine dependence, cigarettes, uncomplicated: Secondary | ICD-10-CM | POA: Diagnosis not present

## 2016-01-09 DIAGNOSIS — M5104 Intervertebral disc disorders with myelopathy, thoracic region: Secondary | ICD-10-CM | POA: Diagnosis not present

## 2016-01-09 DIAGNOSIS — K219 Gastro-esophageal reflux disease without esophagitis: Secondary | ICD-10-CM | POA: Insufficient documentation

## 2016-01-09 DIAGNOSIS — M5127 Other intervertebral disc displacement, lumbosacral region: Secondary | ICD-10-CM | POA: Diagnosis not present

## 2016-01-09 DIAGNOSIS — Z888 Allergy status to other drugs, medicaments and biological substances status: Secondary | ICD-10-CM | POA: Diagnosis not present

## 2016-01-09 DIAGNOSIS — Z91010 Allergy to peanuts: Secondary | ICD-10-CM | POA: Insufficient documentation

## 2016-01-09 DIAGNOSIS — I10 Essential (primary) hypertension: Secondary | ICD-10-CM | POA: Diagnosis not present

## 2016-01-09 DIAGNOSIS — Z6841 Body Mass Index (BMI) 40.0 and over, adult: Secondary | ICD-10-CM | POA: Insufficient documentation

## 2016-01-09 HISTORY — DX: Personal history of urinary calculi: Z87.442

## 2016-01-09 HISTORY — DX: Essential (primary) hypertension: I10

## 2016-01-09 HISTORY — DX: Myoneural disorder, unspecified: G70.9

## 2016-01-09 HISTORY — DX: Unspecified asthma, uncomplicated: J45.909

## 2016-01-09 LAB — CBC
HCT: 49.3 % (ref 39.0–52.0)
Hemoglobin: 16.9 g/dL (ref 13.0–17.0)
MCH: 29.8 pg (ref 26.0–34.0)
MCHC: 34.3 g/dL (ref 30.0–36.0)
MCV: 86.8 fL (ref 78.0–100.0)
Platelets: 212 10*3/uL (ref 150–400)
RBC: 5.68 MIL/uL (ref 4.22–5.81)
RDW: 13.1 % (ref 11.5–15.5)
WBC: 9.7 10*3/uL (ref 4.0–10.5)

## 2016-01-09 LAB — BASIC METABOLIC PANEL
Anion gap: 8 (ref 5–15)
BUN: 19 mg/dL (ref 6–20)
CHLORIDE: 106 mmol/L (ref 101–111)
CO2: 25 mmol/L (ref 22–32)
Calcium: 9.5 mg/dL (ref 8.9–10.3)
Creatinine, Ser: 0.89 mg/dL (ref 0.61–1.24)
GFR calc Af Amer: >60 mL/min (ref >60)
GFR calc non Af Amer: >60 mL/min (ref >60)
GLUCOSE: 102 mg/dL — AB (ref 65–99)
POTASSIUM: 4.4 mmol/L (ref 3.5–5.1)
SODIUM: 139 mmol/L (ref 135–145)

## 2016-01-09 LAB — TYPE AND SCREEN
ABO/RH(D): A POS
ANTIBODY SCREEN: NEGATIVE

## 2016-01-09 LAB — SURGICAL PCR SCREEN
MRSA, PCR: NEGATIVE
STAPHYLOCOCCUS AUREUS: NEGATIVE

## 2016-01-09 LAB — ABO/RH: ABO/RH(D): A POS

## 2016-01-09 SURGERY — POSTERIOR LUMBAR FUSION 1 LEVEL
Anesthesia: General | Site: Spine Lumbar

## 2016-01-09 MED ORDER — ZOLPIDEM TARTRATE 5 MG PO TABS: 5.0000 mg | ORAL_TABLET | Freq: Every evening | ORAL | Status: DC | PRN

## 2016-01-09 MED ORDER — DEXAMETHASONE SODIUM PHOSPHATE 4 MG/ML IJ SOLN
4.0000 mg | Freq: Four times a day (QID) | INTRAMUSCULAR | Status: AC
  Administered 2016-01-09: 4 mg via INTRAVENOUS
  Filled 2016-01-09: qty 1

## 2016-01-09 MED ORDER — POTASSIUM CHLORIDE IN NACL 20-0.9 MEQ/L-% IV SOLN
INTRAVENOUS | Status: DC
  Administered 2016-01-09: 23:00:00 via INTRAVENOUS
  Filled 2016-01-09 (×2): qty 1000

## 2016-01-09 MED ORDER — LIDOCAINE HCL (CARDIAC) 20 MG/ML IV SOLN
INTRAVENOUS | Status: DC | PRN
  Administered 2016-01-09: 100 mg via INTRAVENOUS

## 2016-01-09 MED ORDER — THROMBIN 20000 UNITS EX SOLR
CUTANEOUS | Status: AC
  Filled 2016-01-09: qty 20000

## 2016-01-09 MED ORDER — PROPOFOL 10 MG/ML IV BOLUS
INTRAVENOUS | Status: DC | PRN
  Administered 2016-01-09: 200 mg via INTRAVENOUS

## 2016-01-09 MED ORDER — HYDROMORPHONE HCL 1 MG/ML IJ SOLN
0.2500 mg | INTRAMUSCULAR | Status: DC | PRN
  Administered 2016-01-09 (×2): 0.25 mg via INTRAVENOUS
  Administered 2016-01-09: 0.5 mg via INTRAVENOUS
  Administered 2016-01-09: 0.25 mg via INTRAVENOUS
  Administered 2016-01-09: 0.5 mg via INTRAVENOUS

## 2016-01-09 MED ORDER — BUPIVACAINE HCL (PF) 0.5 % IJ SOLN
INTRAMUSCULAR | Status: AC
  Filled 2016-01-09: qty 30

## 2016-01-09 MED ORDER — DEXAMETHASONE 4 MG PO TABS
4.0000 mg | ORAL_TABLET | Freq: Four times a day (QID) | ORAL | Status: AC
  Administered 2016-01-10 (×2): 4 mg via ORAL
  Filled 2016-01-09 (×2): qty 1

## 2016-01-09 MED ORDER — ROCURONIUM BROMIDE 100 MG/10ML IV SOLN
INTRAVENOUS | Status: DC | PRN
Start: 2016-01-09 — End: 2016-01-09
  Administered 2016-01-09: 60 mg via INTRAVENOUS
  Administered 2016-01-09 (×3): 20 mg via INTRAVENOUS

## 2016-01-09 MED ORDER — SODIUM CHLORIDE 0.9 % IV SOLN: 250.0000 mL | INTRAVENOUS | Status: DC

## 2016-01-09 MED ORDER — ACETAMINOPHEN 325 MG PO TABS: 650.0000 mg | ORAL_TABLET | ORAL | Status: DC | PRN

## 2016-01-09 MED ORDER — MUPIROCIN 2 % EX OINT
1.0000 "application " | TOPICAL_OINTMENT | Freq: Once | CUTANEOUS | Status: DC
  Filled 2016-01-09: qty 22

## 2016-01-09 MED ORDER — EPHEDRINE SULFATE 50 MG/ML IJ SOLN
INTRAMUSCULAR | Status: DC | PRN
  Administered 2016-01-09 (×2): 5 mg via INTRAVENOUS

## 2016-01-09 MED ORDER — PANTOPRAZOLE SODIUM 40 MG PO TBEC
40.0000 mg | DELAYED_RELEASE_TABLET | Freq: Every day | ORAL | Status: DC
  Administered 2016-01-10: 40 mg via ORAL
  Filled 2016-01-09: qty 1

## 2016-01-09 MED ORDER — SUGAMMADEX SODIUM 500 MG/5ML IV SOLN
INTRAVENOUS | Status: DC | PRN
  Administered 2016-01-09: 283 mg via INTRAVENOUS

## 2016-01-09 MED ORDER — SODIUM CHLORIDE 0.9% FLUSH: 3.0000 mL | INTRAVENOUS | Status: DC | PRN

## 2016-01-09 MED ORDER — BUPIVACAINE HCL 0.5 % IJ SOLN
INTRAMUSCULAR | Status: DC | PRN
  Administered 2016-01-09: 10 mL
  Administered 2016-01-09: 20 mL

## 2016-01-09 MED ORDER — DEXAMETHASONE SODIUM PHOSPHATE 10 MG/ML IJ SOLN
INTRAMUSCULAR | Status: DC | PRN
  Administered 2016-01-09: 6 mg via INTRAVENOUS
  Administered 2016-01-09: 10 mg via INTRAVENOUS

## 2016-01-09 MED ORDER — CHLORHEXIDINE GLUCONATE CLOTH 2 % EX PADS: 6.0000 | MEDICATED_PAD | Freq: Once | CUTANEOUS | Status: DC

## 2016-01-09 MED ORDER — SUCCINYLCHOLINE CHLORIDE 20 MG/ML IJ SOLN
INTRAMUSCULAR | Status: DC | PRN
  Administered 2016-01-09: 120 mg via INTRAVENOUS

## 2016-01-09 MED ORDER — FENTANYL CITRATE (PF) 100 MCG/2ML IJ SOLN
INTRAMUSCULAR | Status: AC
  Filled 2016-01-09: qty 2

## 2016-01-09 MED ORDER — FENTANYL CITRATE (PF) 100 MCG/2ML IJ SOLN
INTRAMUSCULAR | Status: DC | PRN
  Administered 2016-01-09: 100 ug via INTRAVENOUS
  Administered 2016-01-09 (×2): 50 ug via INTRAVENOUS
  Administered 2016-01-09: 100 ug via INTRAVENOUS

## 2016-01-09 MED ORDER — SODIUM CHLORIDE 0.9% FLUSH
3.0000 mL | Freq: Two times a day (BID) | INTRAVENOUS | Status: DC
  Administered 2016-01-09: 3 mL via INTRAVENOUS

## 2016-01-09 MED ORDER — LACTATED RINGERS IV SOLN
INTRAVENOUS | Status: DC | PRN
  Administered 2016-01-09 (×2): via INTRAVENOUS

## 2016-01-09 MED ORDER — CEFAZOLIN SODIUM-DEXTROSE 2-4 GM/100ML-% IV SOLN
2.0000 g | INTRAVENOUS | Status: AC
  Administered 2016-01-09 (×2): 2 g via INTRAVENOUS
  Filled 2016-01-09: qty 100

## 2016-01-09 MED ORDER — MIDAZOLAM HCL 5 MG/5ML IJ SOLN
INTRAMUSCULAR | Status: DC | PRN
  Administered 2016-01-09: 2 mg via INTRAVENOUS

## 2016-01-09 MED ORDER — SENNA 8.6 MG PO TABS
1.0000 | ORAL_TABLET | Freq: Two times a day (BID) | ORAL | Status: DC
  Administered 2016-01-09 – 2016-01-10 (×2): 8.6 mg via ORAL
  Filled 2016-01-09 (×2): qty 1

## 2016-01-09 MED ORDER — BISACODYL 5 MG PO TBEC: 5.0000 mg | DELAYED_RELEASE_TABLET | Freq: Every day | ORAL | Status: DC | PRN

## 2016-01-09 MED ORDER — MORPHINE SULFATE (PF) 2 MG/ML IV SOLN
1.0000 mg | INTRAVENOUS | Status: DC | PRN
  Administered 2016-01-09: 2 mg via INTRAVENOUS
  Filled 2016-01-09: qty 1

## 2016-01-09 MED ORDER — ALBUMIN HUMAN 5 % IV SOLN
INTRAVENOUS | Status: DC | PRN
  Administered 2016-01-09 (×4): via INTRAVENOUS

## 2016-01-09 MED ORDER — ADULT MULTIVITAMIN W/MINERALS CH
1.0000 | ORAL_TABLET | Freq: Every day | ORAL | Status: DC
  Administered 2016-01-10: 1 via ORAL
  Filled 2016-01-09: qty 1

## 2016-01-09 MED ORDER — HYDROCODONE-ACETAMINOPHEN 5-325 MG PO TABS: 1.0000 | ORAL_TABLET | ORAL | Status: DC | PRN

## 2016-01-09 MED ORDER — LACTATED RINGERS IV SOLN
INTRAVENOUS | Status: DC
  Administered 2016-01-09: 10 mL/h via INTRAVENOUS

## 2016-01-09 MED ORDER — MENTHOL 3 MG MT LOZG: 1.0000 | LOZENGE | OROMUCOSAL | Status: DC | PRN

## 2016-01-09 MED ORDER — LIDOCAINE-EPINEPHRINE 1 %-1:100000 IJ SOLN
INTRAMUSCULAR | Status: AC
  Filled 2016-01-09: qty 1

## 2016-01-09 MED ORDER — SURGIFOAM 100 EX MISC
CUTANEOUS | Status: DC | PRN
  Administered 2016-01-09: 11:00:00 via TOPICAL

## 2016-01-09 MED ORDER — PHENOL 1.4 % MT LIQD: 1.0000 | OROMUCOSAL | Status: DC | PRN

## 2016-01-09 MED ORDER — DIAZEPAM 5 MG PO TABS: 5.0000 mg | ORAL_TABLET | Freq: Four times a day (QID) | ORAL | Status: DC | PRN

## 2016-01-09 MED ORDER — HYDROMORPHONE HCL 2 MG/ML IJ SOLN
INTRAMUSCULAR | Status: AC
  Administered 2016-01-09: 0.5 mg
  Filled 2016-01-09: qty 1

## 2016-01-09 MED ORDER — LACTATED RINGERS IV SOLN: INTRAVENOUS | Status: DC

## 2016-01-09 MED ORDER — CEFAZOLIN SODIUM 1 G IJ SOLR
INTRAMUSCULAR | Status: AC
  Filled 2016-01-09: qty 20

## 2016-01-09 MED ORDER — PHENYLEPHRINE HCL 10 MG/ML IJ SOLN
INTRAVENOUS | Status: DC | PRN
  Administered 2016-01-09: 25 ug/min via INTRAVENOUS

## 2016-01-09 MED ORDER — PROPOFOL 10 MG/ML IV BOLUS
INTRAVENOUS | Status: AC
  Filled 2016-01-09: qty 40

## 2016-01-09 MED ORDER — ONDANSETRON HCL 4 MG/2ML IJ SOLN: 4.0000 mg | INTRAMUSCULAR | Status: DC | PRN

## 2016-01-09 MED ORDER — FENTANYL CITRATE (PF) 100 MCG/2ML IJ SOLN
INTRAMUSCULAR | Status: AC
  Filled 2016-01-09: qty 4

## 2016-01-09 MED ORDER — ACETAMINOPHEN 650 MG RE SUPP: 650.0000 mg | RECTAL | Status: DC | PRN

## 2016-01-09 MED ORDER — OXYCODONE-ACETAMINOPHEN 5-325 MG PO TABS
1.0000 | ORAL_TABLET | ORAL | Status: DC | PRN
Start: 2016-01-09 — End: 2016-01-10
  Administered 2016-01-10: 2 via ORAL
  Filled 2016-01-09 (×2): qty 2

## 2016-01-09 MED ORDER — 0.9 % SODIUM CHLORIDE (POUR BTL) OPTIME
TOPICAL | Status: DC | PRN
  Administered 2016-01-09: 1000 mL

## 2016-01-09 MED ORDER — MAGNESIUM CITRATE PO SOLN: 1.0000 | Freq: Once | ORAL | Status: DC | PRN

## 2016-01-09 MED ORDER — PROMETHAZINE HCL 25 MG/ML IJ SOLN: 6.2500 mg | INTRAMUSCULAR | Status: DC | PRN

## 2016-01-09 MED ORDER — CHLORHEXIDINE GLUCONATE CLOTH 2 % EX PADS
6.0000 | MEDICATED_PAD | Freq: Once | CUTANEOUS | Status: DC
Start: 2016-01-09 — End: 2016-01-09

## 2016-01-09 MED ORDER — MIDAZOLAM HCL 2 MG/2ML IJ SOLN
INTRAMUSCULAR | Status: AC
  Filled 2016-01-09: qty 2

## 2016-01-09 MED ORDER — KETOROLAC TROMETHAMINE 30 MG/ML IJ SOLN
30.0000 mg | Freq: Four times a day (QID) | INTRAMUSCULAR | Status: DC
  Administered 2016-01-10 (×2): 30 mg via INTRAVENOUS
  Filled 2016-01-09 (×2): qty 1

## 2016-01-09 SURGICAL SUPPLY — 70 items
ADH SKN CLS APL DERMABOND .7 (GAUZE/BANDAGES/DRESSINGS) ×2
APL SKNCLS STERI-STRIP NONHPOA (GAUZE/BANDAGES/DRESSINGS)
BENZOIN TINCTURE PRP APPL 2/3 (GAUZE/BANDAGES/DRESSINGS) IMPLANT
BIT DRILL NEURO 2X3.1 SFT TUCH (MISCELLANEOUS) IMPLANT
BLADE CLIPPER SURG (BLADE) IMPLANT
BUR MATCHSTICK NEURO 3.0 LAGG (BURR) ×3 IMPLANT
BUR PRECISION FLUTE 5.0 (BURR) ×3 IMPLANT
CANISTER SUCT 3000ML PPV (MISCELLANEOUS) ×3 IMPLANT
CLOSURE WOUND 1/2 X4 (GAUZE/BANDAGES/DRESSINGS)
CONT SPEC 4OZ CLIKSEAL STRL BL (MISCELLANEOUS) ×3 IMPLANT
COVER BACK TABLE 60X90IN (DRAPES) ×3 IMPLANT
DECANTER SPIKE VIAL GLASS SM (MISCELLANEOUS) ×3 IMPLANT
DERMABOND ADVANCED (GAUZE/BANDAGES/DRESSINGS) ×4
DERMABOND ADVANCED .7 DNX12 (GAUZE/BANDAGES/DRESSINGS) ×1 IMPLANT
DRAPE C-ARM 42X72 X-RAY (DRAPES) ×8 IMPLANT
DRAPE C-ARMOR (DRAPES) ×2 IMPLANT
DRAPE LAPAROTOMY 100X72X124 (DRAPES) ×3 IMPLANT
DRAPE MICROSCOPE LEICA (MISCELLANEOUS) ×2 IMPLANT
DRAPE POUCH INSTRU U-SHP 10X18 (DRAPES) ×3 IMPLANT
DRAPE SURG 17X23 STRL (DRAPES) ×3 IMPLANT
DRILL NEURO 2X3.1 SOFT TOUCH (MISCELLANEOUS) ×3
DRSG OPSITE POSTOP 4X6 (GAUZE/BANDAGES/DRESSINGS) ×2 IMPLANT
DURAPREP 26ML APPLICATOR (WOUND CARE) ×3 IMPLANT
ELECT BLADE 4.0 EZ CLEAN MEGAD (MISCELLANEOUS) ×3
ELECT REM PT RETURN 9FT ADLT (ELECTROSURGICAL) ×3
ELECTRODE BLDE 4.0 EZ CLN MEGD (MISCELLANEOUS) IMPLANT
ELECTRODE REM PT RTRN 9FT ADLT (ELECTROSURGICAL) ×1 IMPLANT
GAUZE SPONGE 4X4 12PLY STRL (GAUZE/BANDAGES/DRESSINGS) IMPLANT
GAUZE SPONGE 4X4 16PLY XRAY LF (GAUZE/BANDAGES/DRESSINGS) ×2 IMPLANT
GLOVE BIO SURGEON STRL SZ 6.5 (GLOVE) ×1 IMPLANT
GLOVE BIO SURGEON STRL SZ7 (GLOVE) ×2 IMPLANT
GLOVE BIO SURGEONS STRL SZ 6.5 (GLOVE) ×1
GLOVE BIOGEL PI IND STRL 6.5 (GLOVE) IMPLANT
GLOVE BIOGEL PI IND STRL 7.5 (GLOVE) IMPLANT
GLOVE BIOGEL PI INDICATOR 6.5 (GLOVE) ×6
GLOVE BIOGEL PI INDICATOR 7.5 (GLOVE) ×2
GLOVE ECLIPSE 6.5 STRL STRAW (GLOVE) ×6 IMPLANT
GLOVE EXAM NITRILE LRG STRL (GLOVE) IMPLANT
GLOVE EXAM NITRILE XL STR (GLOVE) IMPLANT
GLOVE EXAM NITRILE XS STR PU (GLOVE) IMPLANT
GLOVE SURG SS PI 6.5 STRL IVOR (GLOVE) ×10 IMPLANT
GLOVE SURG SS PI 7.0 STRL IVOR (GLOVE) ×4 IMPLANT
GOWN STRL REUS W/ TWL LRG LVL3 (GOWN DISPOSABLE) ×2 IMPLANT
GOWN STRL REUS W/ TWL XL LVL3 (GOWN DISPOSABLE) IMPLANT
GOWN STRL REUS W/TWL 2XL LVL3 (GOWN DISPOSABLE) IMPLANT
GOWN STRL REUS W/TWL LRG LVL3 (GOWN DISPOSABLE) ×9
GOWN STRL REUS W/TWL XL LVL3 (GOWN DISPOSABLE) ×9
KIT BASIN OR (CUSTOM PROCEDURE TRAY) ×3 IMPLANT
KIT POSITION SURG JACKSON T1 (MISCELLANEOUS) ×3 IMPLANT
KIT ROOM TURNOVER OR (KITS) ×3 IMPLANT
NDL HYPO 25X1 1.5 SAFETY (NEEDLE) ×1 IMPLANT
NDL SPNL 18GX3.5 QUINCKE PK (NEEDLE) IMPLANT
NEEDLE HYPO 25X1 1.5 SAFETY (NEEDLE) ×3 IMPLANT
NEEDLE SPNL 18GX3.5 QUINCKE PK (NEEDLE) ×3 IMPLANT
NS IRRIG 1000ML POUR BTL (IV SOLUTION) ×3 IMPLANT
PACK LAMINECTOMY NEURO (CUSTOM PROCEDURE TRAY) ×3 IMPLANT
PAD ARMBOARD 7.5X6 YLW CONV (MISCELLANEOUS) ×6 IMPLANT
RUBBERBAND STERILE (MISCELLANEOUS) ×2 IMPLANT
SPONGE LAP 4X18 X RAY DECT (DISPOSABLE) IMPLANT
SPONGE SURGIFOAM ABS GEL 100 (HEMOSTASIS) ×3 IMPLANT
STRIP CLOSURE SKIN 1/2X4 (GAUZE/BANDAGES/DRESSINGS) IMPLANT
SUT PROLENE 6 0 BV (SUTURE) IMPLANT
SUT VIC AB 0 CT1 18XCR BRD8 (SUTURE) ×1 IMPLANT
SUT VIC AB 0 CT1 8-18 (SUTURE) ×12
SUT VIC AB 2-0 CT1 18 (SUTURE) ×11 IMPLANT
SUT VIC AB 3-0 SH 8-18 (SUTURE) ×11 IMPLANT
TOWEL OR 17X24 6PK STRL BLUE (TOWEL DISPOSABLE) ×3 IMPLANT
TOWEL OR 17X26 10 PK STRL BLUE (TOWEL DISPOSABLE) ×3 IMPLANT
TRAY FOLEY W/METER SILVER 16FR (SET/KITS/TRAYS/PACK) ×3 IMPLANT
WATER STERILE IRR 1000ML POUR (IV SOLUTION) ×3 IMPLANT

## 2016-01-09 NOTE — Op Note (Signed)
01/09/2016  10:04 PM  PATIENT:  Antonio Griffith  30 y.o. 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 He was positioned prone on a Wilson frame with all pressure points padded. His back was prepped and draped in a sterile manner. I opened the skin for the L5/S1 exposure with a 10 blade and carried the dissection down to the thoracolumbar fascia. I used both sharp dissection and the monopolar cautery to expose the lamina of L5, and S1. I confirmed my location with an intraoperative xray.  I used a 15 blade to divide the ligamentum flavum and expose the thecal sac.  I brought the microscope into the operative field and with Dr.Cram's assistance we started our decompression of the spinal canal, thecal sac and S1 root(s). I cauterized epidural veins overlying the disc space which was heavily calcified. The disc rupture was calcified and I used a drill to enter the disc space. Dr. Saintclair Halsted and I retracted the thecal sac then used a rongeur to take down the large hump which was calcified in the axilla.We were able to enter the disc space and removed markedly degenerated disc. We continued to whittle away the calcified lump with Epstein curettes. After the discectomy was completed we inspected the S1 nerve root and felt it  was well decompressed. I explored rostrally, laterally, medially, and caudally and was satisfied with the decompression. I irrigated the wound, then closed in layers. I approximated the thoracolumbar fascia, subcutaneous, and subcuticular planes with vicryl sutures. I used dermabond for a sterile dressing.  I then located my thoracic incision to expose T11/12. I opened the skin with a 10 blade, and dissected sharply exposing the T11, and 12 lamina on the right side. I confirmed my location with fluoroscopy. I used the drill to expose the lateral aspect of the 11/12 disc. I took the 11/12 facet in the exposure. I opened the disc space with a 15 blade and started the discetomy. With the microscope in place for microdissection we worked painstakingly underneath and lateral to the spinal cord to decompress the cord. Using a variety of hooks, ball tipped dissectors, pituitary rongeurs, Kerrison punches, we slowly and carefully removed the central disc herniation and decompressed the canal. I then irrigated and closed the wound in layers approximating the thoracolumbar fascia, subcutaneous, and subcuticular planes. I applied a sterile dressing. He was rolled onto the stretcher and extubated.  PLAN OF CARE: Admit to inpatient   PATIENT DISPOSITION:  PACU - hemodynamically stable.   Delay start of Pharmacological VTE agent (>24hrs) due to surgical blood loss or risk of bleeding:  yes

## 2016-01-09 NOTE — Transfer of Care (Signed)
Immediate Anesthesia Transfer of Care Note  Patient: Antonio Griffith  Procedure(s) Performed: Procedure(s): MICRODISCECTOMY THORACIC ELEVEN -THORACIC TWELVE RIGHT, LEFT LUMBAR FIVE SACRAL ONE DISCECTOMY (N/A)  Patient Location: PACU  Anesthesia Type:General  Level of Consciousness: awake and alert   Airway & Oxygen Therapy: Patient Spontanous Breathing and Patient connected to nasal cannula oxygen  Post-op Assessment: Report given to RN and Post -op Vital signs reviewed and stable  Post vital signs: Reviewed and stable  Last Vitals:  Vitals:   01/09/16 0847 01/09/16 2014  BP: (!) 155/81   Pulse: 87   Resp: 20   Temp: 36.7 C (P) 36.4 C    Last Pain:  Vitals:   01/09/16 2014  TempSrc:   PainSc: (P) Asleep      Patients Stated Pain Goal: 6 ([id number])  Complications: No apparent anesthesia complications

## 2016-01-09 NOTE — H&P (Signed)
BP (!) 155/81   Pulse 87   Temp 98.1 F (36.7 C) (Oral)   Resp 20   Ht 5\' 9"  (1.753 m)   Wt (!) 141.5 kg (312 lb)   SpO2 97%   BMI 46.07 kg/m  Antonio Griffith is a 30 year old gentleman who presents today for evaluation of numbness that he has in both legs and feet, pain into his left lower extremity, all of this after slipping and falling while at work in the [place]. He says he fell onto his right hip and ever since has had a numb, burning, and tingling sensation in the legs and feet. This is worse whenever he lies down. He has had no physical therapy. He has had no medication. He has been seen by two other physicians, most recently Dr. Lorin Griffith of an orthopedic spine surgeon. Dr. Lorin Griffith felt that he did need surgery secondary to cord compression which is already left altered cord signal at T11-12, and a large disc herniation at L5-S1. He says he will also experience a sharp pain into the left lower extremity. He says sitting will cause numbness in his feet. Lying down on his back will cause his feet to sleep, lying down on his side will cause whichever lower extremity he is lying on to fall asleep. He did see a chiropractor who recommended after seeing him. He has had no bowel or bladder dysfunction. He has had no sexual dysfunction. He does have irritable bowel syndrome. In his words he has a numbing sensation in the legs and in the feet, and he fell 09/28/2015. He has weakness in his legs and headaches. REVIEW OF SYSTEMS: Review of systems is positive for infections, leg pain with walking, leg weakness, back pain, and food allergies. PAST SURGICAL HISTORY: He has had knee surgery and gall bladder surgery. FAMILY HISTORY: Mother is 64 and is in good health. Father is 22 and is in good health.  SOCIAL HISTORY: He is a Dealer, right-handed. He does smoke. He does not drink alcohol. No history of substance abuse. CURRENT MEDICATIONS: He takes Linzess and Linzop. PHYSICAL EXAMINATION: He is 5 feet 9  inches tall, he weighs 314 pounds, temperature is 97.7, blood pressure is 134/87, pulse is 81, and respiratory rate is 14, pain is 5/10.  On examination he is alert, oriented x4, and answering all questions appropriately. 5/5 strength in the upper extremities. 2+ reflexes. He has 3+ reflexes at the knees and ankles. He has clonus left more so than right at the ankles. He has intact proprioception of both lower extremities. He does have some weakness with plantar flexion in the left foot. Romberg test is positive. He has reported problems with his balance. Pupils are equal, round, and reactive to light, full extraocular movements, full visual fields, hearing is intact to finger rub. Uvula elevates in the midline. Shoulder shrug is normal. Tongue protrudes in the midline.  IMAGING STUDIES: He has on MRI a large disc herniation at L5-S1 eccentric to the left side, causing significant compression of the thecal sac and S1 root. He has a smaller discs on the right side at L5-S1. He has a calcified disc at T11-12 causing significant cord compression.  IMPRESSION/PLAN: I recommend that he under operative decompression at T11-12 to relieve the cord compression, and at L5-S1 to relieve the nerve root compression. I do believe he may very well need fixation after his operation at T11-12, but it does depend on how much bone I remove, but I have prepared  him for that. I also know that this would need to be done bilaterally. Benefits were explained. I explained in great detail that his spinal cord is already damaged to some degree, and that doing the surgery may very well leave him paralyzed. It could leave him weaker, it could leave him temporarily weaker, that could be temporary or permanent. His sensation may also be changed. Bowel or bladder function and sexual function may be impaired significantly. His symptoms may not resolve. We still need to await Worker's Comp approval, but I would try to do this as soon as they do  give me approval.

## 2016-01-09 NOTE — Anesthesia Preprocedure Evaluation (Signed)
Anesthesia Evaluation  Patient identified by MRN, date of birth, ID band Patient awake    Reviewed: Allergy & Precautions, NPO status , Patient's Chart, lab work & pertinent test results  Airway Mallampati: III  TM Distance: >3 FB Neck ROM: Full    Dental no notable dental hx. (+) Dental Advisory Given   Pulmonary Current Smoker,    Pulmonary exam normal        Cardiovascular hypertension, Normal cardiovascular exam     Neuro/Psych negative psych ROS   GI/Hepatic Neg liver ROS, GERD  Medicated and Controlled,  Endo/Other  Morbid obesity  Renal/GU negative Renal ROS     Musculoskeletal   Abdominal   Peds  Hematology   Anesthesia Other Findings   Reproductive/Obstetrics                             Anesthesia Physical Anesthesia Plan  ASA: III  Anesthesia Plan: General   Post-op Pain Management:    Induction: Intravenous  Airway Management Planned: Oral ETT  Additional Equipment:   Intra-op Plan:   Post-operative Plan: Extubation in OR  Informed Consent: I have reviewed the patients History and Physical, chart, labs and discussed the procedure including the risks, benefits and alternatives for the proposed anesthesia with the patient or authorized representative who has indicated his/her understanding and acceptance.   Dental advisory given  Plan Discussed with: Anesthesiologist  Anesthesia Plan Comments:         Anesthesia Quick Evaluation

## 2016-01-09 NOTE — Anesthesia Procedure Notes (Signed)
Procedure Name: Intubation Date/Time: 01/09/2016 2:02 PM Performed by: Tressia Miners LEFFEW Pre-anesthesia Checklist: Patient identified, Patient being monitored, Timeout performed, Emergency Drugs available and Suction available Patient Re-evaluated:Patient Re-evaluated prior to inductionOxygen Delivery Method: Circle System Utilized Preoxygenation: Pre-oxygenation with 100% oxygen Intubation Type: IV induction Ventilation: Mask ventilation without difficulty Laryngoscope Size: Mac and 4 Grade View: Grade I Tube type: Oral Tube size: 7.5 mm Number of attempts: 1 Airway Equipment and Method: Stylet Placement Confirmation: ETT inserted through vocal cords under direct vision,  positive ETCO2 and breath sounds checked- equal and bilateral Secured at: 23 cm Tube secured with: Tape Dental Injury: Teeth and Oropharynx as per pre-operative assessment

## 2016-01-10 DIAGNOSIS — M5104 Intervertebral disc disorders with myelopathy, thoracic region: Secondary | ICD-10-CM | POA: Diagnosis not present

## 2016-01-10 MED ORDER — CYCLOBENZAPRINE HCL 10 MG PO TABS: 10.0000 mg | ORAL_TABLET | Freq: Three times a day (TID) | ORAL | 0 refills | Status: DC | PRN

## 2016-01-10 MED ORDER — TRAMADOL HCL 50 MG PO TABS: 50.0000 mg | ORAL_TABLET | Freq: Four times a day (QID) | ORAL | 0 refills | Status: DC | PRN

## 2016-01-10 NOTE — Care Management Note (Signed)
Case Management Note  Patient Details  Name: Kymon Logalbo MRN: [id number] Date of Birth: Oct 15, 1985  Subjective/Objective:   Pt underwent:  MICRODISCECTOMY THORACIC ELEVEN -THORACIC TWELVE RIGHT, LEFT LUMBAR FIVE SACRAL ONE DISCECTOMY. He is from home.                Action/Plan: Awaiting PT/OT recommendations. CM following for d/c needs.   Expected Discharge Date:                  Expected Discharge Plan:     In-House Referral:     Discharge planning Services     Post Acute Care Choice:    Choice offered to:     DME Arranged:    DME Agency:     HH Arranged:    HH Agency:     Status of Service:  In process, will continue to follow  If discussed at Long Length of Stay Meetings, dates discussed:    Additional Comments:  Pollie Friar, RN 01/10/2016, 10:25 AM

## 2016-01-10 NOTE — Progress Notes (Signed)
Patient A/O, no noted distress. Denies pain. C/O numbness LUE/tongue. Educated on meds and positioning (safety). Patient has been ambulating in room. Dressing dry and no noted drainage. Ambulated in hallway with PT, did exceptional well. IV came out while ambulating with PT. Staff will continue to monitor and meet needs.

## 2016-01-10 NOTE — Discharge Instructions (Signed)
Lumbar Discectomy °Care After °A discectomy involves removal of discmaterial (the cartilage-like structures located between the bones of the back). It is done to relieve pressure on nerve roots. It can be used as a treatment for a back problem. The time in surgery depends on the findings in surgery and what is necessary to correct the problems. °HOME CARE INSTRUCTIONS  °· Check the cut (incision) made by the surgeon twice a day for signs of infection. Some signs of infection may include:  °· A foul smelling, greenish or yellowish discharge from the wound.  °· Increased pain.  °· Increased redness over the incision (operative) site.  °· The skin edges may separate.  °· Flu-like symptoms (problems).  °· A temperature above 101.5° F (38.6° C).  °· Change your bandages in about 24 to 36 hours following surgery or as directed.  °· You may shower tomrrow.  Avoid bathtubs, swimming pools and hot tubs for three weeks or until your incision has healed completely. °· Follow your doctor's instructions as to safe activities, exercises, and physical therapy.  °· Weight reduction may be beneficial if you are overweight.  °· Daily exercise is helpful to prevent the return of problems. Walking is permitted. You may use a treadmill without an incline. Cut down on activities and exercise if you have discomfort. You may also go up and down stairs as much as you can tolerate.  °· DO NOT lift anything heavier than 10 to 15 lbs. Avoid bending or twisting at the waist. Always bend your knees when lifting.  °· Maintain strength and range of motion as instructed.  °· Do not drive for 10 days, or as directed by your doctors. You may be a passenger . Lying back in the passenger seat may be more comfortable for you. Always wear a seatbelt.  °· Limit your sitting in a regular chair to 20 to 30 minutes at a time. There are no limitations for sitting in a recliner. You should lie down or walk in between sitting periods.  °· Only take  over-the-counter or prescription medicines for pain, discomfort, or fever as directed by your caregiver.  °SEEK MEDICAL CARE IF:  °· There is increased bleeding (more than a small spot) from the wound.  °· You notice redness, swelling, or increasing pain in the wound.  °· Pus is coming from wound.  °· You develop an unexplained oral temperature above 102° F (38.9° C) develops.  °· You notice a foul smell coming from the wound or dressing.  °· You have increasing pain in your wound.  °SEEK IMMEDIATE MEDICAL CARE IF:  °· You develop a rash.  °· You have difficulty breathing.  °· You develop any allergic problems to medicines given.  °Document Released: 01/23/2004 Document Revised: 02/06/2011 Document Reviewed: 05/13/2007 °ExitCare® Patient Information °

## 2016-01-10 NOTE — Evaluation (Signed)
Physical Therapy Evaluation and Discharge Patient Details Name: Antonio Griffith MRN: [id number] DOB: 1986/01/29 Today's Date: 01/10/2016   History of Present Illness  s/p MICRODISCECTOMY THORACIC ELEVEN -THORACIC TWELVE RIGHT, LEFT LUMBAR FIVE SACRAL ONE DISCECTOMY  Clinical Impression  Patient evaluated by Physical Therapy with no further PT needs identified. All education has been completed and the patient has no further questions. Patient ambulating 180 ft with RW (due to significant antalgic gait/limp) with supervision.  See below for any follow-up Physical Therapy or equipment needs. PT is signing off. Thank you for this referral.     Follow Up Recommendations No PT follow up    Equipment Recommendations  Rolling walker with 5" wheels (wide RW due to body habitus)--discussed he may not need if his "catch" causing him to limp goes away and he can walk smoothly without RW    Recommendations for Other Services OT consult     Precautions / Restrictions Precautions Precautions: Other (comment) (educated in back precautions for comfort) Precaution Comments: provided back handout Required Braces or Orthoses:  (pt thought he was getting a brace; no brace ordered)      Mobility  Bed Mobility Overal bed mobility: Needs Assistance Bed Mobility: Rolling;Sidelying to Sit Rolling: Supervision Sidelying to sit: Supervision       General bed mobility comments: pt impulsively sitting up to EOB despite cues to wait for instructions; +use of rail  Transfers Overall transfer level: Independent Equipment used: None                Ambulation/Gait Ambulation/Gait assistance: Supervision Ambulation Distance (Feet): 180 Feet Assistive device: None;Rolling walker (2 wheeled) Gait Pattern/deviations: Step-through pattern;Decreased stride length;Antalgic;Wide base of support   Gait velocity interpretation: Below normal speed for age/gender General Gait Details: without device,  pt with significant limp; with RW he did not limp. Educated re: frequent changes in position (especially walking), positions of less strain on spine and more strain to avoid.  Stairs            Wheelchair Mobility    Modified Rankin (Stroke Patients Only)       Balance                                             Pertinent Vitals/Pain Pain Assessment: No/denies pain    Home Living Family/patient expects to be discharged to:: Private residence Living Arrangements: Spouse/significant other;Children (17 yo son; wife pregnant) Available Help at Discharge: Family;Available PRN/intermittently Type of Home: Mobile home Home Access: Stairs to enter Entrance Stairs-Rails: Can reach both Entrance Stairs-Number of Steps: 5 Home Layout: One level Home Equipment: None      Prior Function Level of Independence: Independent               Hand Dominance        Extremity/Trunk Assessment   Upper Extremity Assessment:  (reports decr sensation Rt hand)           Lower Extremity Assessment: Overall WFL for tasks assessed      Cervical / Trunk Assessment: Other exceptions  Communication   Communication: No difficulties  Cognition Arousal/Alertness: Awake/alert Behavior During Therapy: WFL for tasks assessed/performed Overall Cognitive Status: Within Functional Limits for tasks assessed                      General Comments  Exercises     Assessment/Plan    PT Assessment Patent does not need any further PT services  PT Problem List            PT Treatment Interventions      PT Goals (Current goals can be found in the Care Plan section)  Acute Rehab PT Goals Patient Stated Goal: go home today; be able to play with his son  PT Goal Formulation: All assessment and education complete, DC therapy    Frequency     Barriers to discharge        Co-evaluation               End of Session   Activity Tolerance:  Patient tolerated treatment well Patient left: in chair;with call bell/phone within reach      Functional Assessment Tool Used: clinical judgement Functional Limitation: Mobility: Walking and moving around Mobility: Walking and Moving Around Current Status [id number]): At least 1 percent but less than 20 percent impaired, limited or restricted Mobility: Walking and Moving Around Goal Status [phone]): At least 1 percent but less than 20 percent impaired, limited or restricted Mobility: Walking and Moving Around Discharge Status [phone]): At least 1 percent but less than 20 percent impaired, limited or restricted    Time: 1028-1059 PT Time Calculation (min) (ACUTE ONLY): 31 min   Charges:   PT Evaluation $PT Eval Low Complexity: 1 Procedure PT Treatments $Therapeutic Activity: 8-22 mins   PT G Codes:   PT G-Codes **NOT FOR INPATIENT CLASS** Functional Assessment Tool Used: clinical judgement Functional Limitation: Mobility: Walking and moving around Mobility: Walking and Moving Around Current Status [id number]): At least 1 percent but less than 20 percent impaired, limited or restricted Mobility: Walking and Moving Around Goal Status [phone]): At least 1 percent but less than 20 percent impaired, limited or restricted Mobility: Walking and Moving Around Discharge Status [phone]): At least 1 percent but less than 20 percent impaired, limited or restricted    Shalawn Wynder 01/10/2016, 11:10 AM Pager [phone]

## 2016-01-10 NOTE — Evaluation (Signed)
Occupational Therapy Evaluation and Discharge  Patient Details Name: Antonio Griffith MRN: [id number] DOB: 02/15/1986 Today's Date: 01/10/2016    History of Present Illness s/p MICRODISCECTOMY THORACIC ELEVEN -THORACIC TWELVE RIGHT, LEFT LUMBAR FIVE SACRAL ONE DISCECTOMY   Clinical Impression   Pt reports he was independent with ADL PTA. Currently pt overall supervision for safety with ADL and functional mobility with the exception of min assist for LB dressing. All back, safety, and ADL education completed with pt and wife. Pt planning to d/c home with intermittent family supervision. No further acute OT needs identified; signing off at this time. Please re-consult if needs change. Thank you for this referral.    Follow Up Recommendations  No OT follow up;Supervision - Intermittent    Equipment Recommendations  None recommended by OT    Recommendations for Other Services       Precautions / Restrictions Precautions Precautions: Back (for comfort) Precaution Booklet Issued: No (PT already provided) Precaution Comments: Pt able to recall 3/3 back precautions and maintain throghout session. Required Braces or Orthoses:  (pt thought he was getting a brace; no brace ordered) Restrictions Weight Bearing Restrictions: No      Mobility Bed Mobility        General bed mobility comments: Pt OOB in chair upon arrival.  Transfers Overall transfer level: Needs assistance Equipment used: None Transfers: Sit to/from Stand Sit to Stand: Supervision         General transfer comment: Supervision for safety. No physical assist, good technique.     Balance Overall balance assessment: Needs assistance Sitting-balance support: Feet supported;No upper extremity supported Sitting balance-Leahy Scale: Good     Standing balance support: No upper extremity supported;During functional activity Standing balance-Leahy Scale: Good                              ADL  Overall ADL's : Needs assistance/impaired Eating/Feeding: Independent;Sitting   Grooming: Supervision/safety;Standing   Upper Body Bathing: Set up;Supervision/ safety;Standing   Lower Body Bathing: Supervison/ safety;Sit to/from stand   Upper Body Dressing : Supervision/safety;Sitting;Set up   Lower Body Dressing: Minimal assistance;Sit to/from stand Lower Body Dressing Details (indicate cue type and reason): Wife assisting with donning shoes upon arrival. Educated pt on compensatory strategies for LB ADL. Toilet Transfer: Supervision/safety;Ambulation;Comfort height toilet Toilet Transfer Details (indicate cue type and reason): Simulated by sit to stand from chair with functional mobility. Toileting- Clothing Manipulation and Hygiene: Supervision/safety;Sit to/from stand Toileting - Clothing Manipulation Details (indicate cue type and reason): Educated pt on peri care without twisting. Tub/ Shower Transfer: Supervision/safety;Tub transfer;Ambulation Tub/Shower Transfer Details (indicate cue type and reason): Educated pt and wife on supervision for safety with tub transfers initially. Functional mobility during ADLs: Supervision/safety General ADL Comments: Educated pt and wife on maintaining back precautions during functional activities, frequent mobility upon return home, keeping frequently used items at counter top height.     Vision     Perception     Praxis      Pertinent Vitals/Pain Pain Assessment: No/denies pain     Hand Dominance Right   Extremity/Trunk Assessment Upper Extremity Assessment Upper Extremity Assessment: RUE deficits/detail RUE Deficits / Details: tingling in hand   Lower Extremity Assessment Lower Extremity Assessment: Defer to PT evaluation   Cervical / Trunk Assessment Cervical / Trunk Assessment: Other exceptions Cervical / Trunk Exceptions: s/p spinal sx, morbid obesity   Communication Communication Communication: No difficulties    Cognition  Arousal/Alertness: Awake/alert Behavior During Therapy: WFL for tasks assessed/performed Overall Cognitive Status: Within Functional Limits for tasks assessed                     General Comments       Exercises       Shoulder Instructions      Home Living Family/patient expects to be discharged to:: Private residence Living Arrangements: Spouse/significant other;Children (2 y.o. son, wife pregnant) Available Help at Discharge: Family;Available PRN/intermittently Type of Home: Mobile home Home Access: Stairs to enter Entrance Stairs-Number of Steps: 5 Entrance Stairs-Rails: Can reach both Home Layout: One level     Bathroom Shower/Tub: Tub/shower unit Shower/tub characteristics: Architectural technologist: Handicapped height (one standard, one elevated) Bathroom Accessibility: Yes   Home Equipment: None          Prior Functioning/Environment Level of Independence: Independent        Comments: Works as a Therapist, music Problem List:     OT Treatment/Interventions:      OT Goals(Current goals can be found in the care plan section) Acute Rehab OT Goals Patient Stated Goal: go home today; be able to play with his son  OT Goal Formulation: All assessment and education complete, DC therapy  OT Frequency:     Barriers to D/C:            Co-evaluation              End of Session Nurse Communication: Mobility status;Other (comment) (ready for d/c from OT standpoint)  Activity Tolerance: Patient tolerated treatment well Patient left: in chair;with call bell/phone within reach;with family/visitor present   Time: [id number] OT Time Calculation (min): 13 min Charges:  OT General Charges $OT Visit: 1 Procedure OT Evaluation $OT Eval Moderate Complexity: 1 Procedure G-Codes: OT G-codes **NOT FOR INPATIENT CLASS** Functional Assessment Tool Used: Clinical judgement Functional Limitation: Self care Self Care Current Status [id number]): At  least 1 percent but less than 20 percent impaired, limited or restricted Self Care Goal Status [id number]): At least 1 percent but less than 20 percent impaired, limited or restricted Self Care Discharge Status [phone]): At least 1 percent but less than 20 percent impaired, limited or restricted   Binnie Kand M.S., OTR/L Pager: [phone]  01/10/2016, 11:40 AM

## 2016-01-10 NOTE — Care Management Note (Signed)
Case Management Note  Patient Details  Name: Antonio Griffith MRN: [id number] Date of Birth: February 27, 1986  Subjective/Objective:                    Action/Plan: Pt discharging home with self care. Pt with orders for rolling walker. CM spoke to patients Workers Compensation rep Roderic Palau: [phone] [facility]) and provided him the information. He asked that orders be faxed to Ivar Drape at: [phone]. DME orders faxed to [facility] with the information that the patient would like the walker sent to his home. Bedside RN updated.   Expected Discharge Date:                  Expected Discharge Plan:  Home/Self Care  In-House Referral:     Discharge planning Services  CM Consult  Post Acute Care Choice:  Durable Medical Equipment Choice offered to:  Patient  DME Arranged:  Walker rolling DME Agency:  Other - Comment (Workers Health and safety inspector)  HH Arranged:    [place]:     Status of Service:  Completed, signed off  If discussed at [employer], dates discussed:    Additional Comments:  Pollie Friar, RN 01/10/2016, 12:43 PM

## 2016-01-10 NOTE — Anesthesia Postprocedure Evaluation (Signed)
Anesthesia Post Note  Patient: Antonio Griffith  Procedure(s) Performed: Procedure(s) (LRB): MICRODISCECTOMY THORACIC ELEVEN -THORACIC TWELVE RIGHT, LEFT LUMBAR FIVE SACRAL ONE DISCECTOMY (N/A)  Patient location during evaluation: PACU Anesthesia Type: General Level of consciousness: awake Pain management: pain level controlled Vital Signs Assessment: post-procedure vital signs reviewed and stable Respiratory status: spontaneous breathing Cardiovascular status: stable Postop Assessment: no signs of nausea or vomiting Anesthetic complications: no    Last Vitals:  Vitals:   01/10/16 0113 01/10/16 0531  BP: 138/74 106/62  Pulse: (!) 106 80  Resp: 18 18  Temp: 36.7 C 36.8 C    Last Pain:  Vitals:   01/10/16 0537  TempSrc:   PainSc: 6                  Macario Shear

## 2016-01-10 NOTE — Discharge Summary (Signed)
Physician Discharge Summary  Patient ID: Antonio Griffith MRN: [id number] DOB/AGE: 1985-08-08 30 y.o.  Admit date: 01/09/2016 Discharge date: 01/10/2016  Admission Diagnoses:hnp left L5/S1, t11/12 Discharge Diagnoses:  Active Problems:   HNP (herniated nucleus pulposus with myelopathy), thoracic   Discharged Condition: good  Hospital Course: Draden Rumley is a 30 y.o. male Whom was admitted to the hospital and underwent an uncomplicated lumbar and thoracic discetomy. He is voiding, ambulating and tolerating a regular diet without difficulty. His wounds Are clean dry and without signs of infection.   Treatments: surgery:MICRODISCECTOMY THORACIC ELEVEN -THORACIC TWELVE RIGHT, LEFT LUMBAR FIVE SACRAL ONE DISCECTOMY   Discharge Exam: Blood pressure 130/69, pulse 88, temperature 98.3 F (36.8 C), temperature source Oral, resp. rate 18, height 5\' 9"  (1.753 m), weight (!) 151.8 kg (334 lb 10.5 oz), SpO2 94 %. General appearance: alert, cooperative, appears stated age and no distress Neurologic: Alert and oriented X 3, normal strength and tone. Normal symmetric reflexes. Normal coordination and gait  Disposition: 01-Home or Self Care HERNIATED INTERVERTEBRAL DISC OF THORACIC SPIN    Medication List    TAKE these medications   cyclobenzaprine 10 MG tablet Commonly known as:  FLEXERIL Take 1 tablet (10 mg total) by mouth 3 (three) times daily as needed for muscle spasms.   DEXILANT 60 MG capsule Generic drug:  dexlansoprazole Take 60 mg by mouth daily.   ibuprofen 200 MG tablet Commonly known as:  ADVIL,MOTRIN Take 200 mg by mouth every 6 (six) hours as needed for moderate pain.   multivitamin with minerals Tabs tablet Take 1 tablet by mouth daily.   traMADol 50 MG tablet Commonly known as:  ULTRAM Take 1 tablet (50 mg total) by mouth every 6 (six) hours as needed.      Follow-up Information    Nazifa Trinka L, MD Follow up in 3 week(s).   Specialty:   Neurosurgery Why:  please call office to make an appointment Contact information: [address] [place] [address] [phone]           Signed: Winfield Cunas 01/10/2016, 11:32 AM

## 2016-01-30 ENCOUNTER — Telehealth: Payer: Self-pay | Admitting: Family Medicine

## 2016-01-30 NOTE — Telephone Encounter (Signed)
Called and spoke with patient. He stopped taking the lisinopril about a month ago. The past 3-4 days he has been feeling jittery and feels like his heart is racing. He thinks he may also have a touch of the flu. Patient has an appt with his Neurology MD tomorrow morning, patient is going to come see [place] at 3:30 tomorrow afternoon.

## 2016-01-30 NOTE — Telephone Encounter (Signed)
° °  Pt said the following med cause has cause his  Pancreas to be inflamed and him to be hospitalized  Pt req a call back    [phone]   LISINOP

## 2016-01-31 ENCOUNTER — Encounter: Payer: Self-pay | Admitting: Family Medicine

## 2016-01-31 ENCOUNTER — Ambulatory Visit (INDEPENDENT_AMBULATORY_CARE_PROVIDER_SITE_OTHER): Payer: 59 | Admitting: Family Medicine

## 2016-01-31 VITALS — BP 148/80 | HR 120 | Temp 99.6°F | Resp 16 | Ht 69.0 in | Wt 319.0 lb

## 2016-01-31 DIAGNOSIS — J019 Acute sinusitis, unspecified: Secondary | ICD-10-CM | POA: Diagnosis not present

## 2016-01-31 DIAGNOSIS — R509 Fever, unspecified: Secondary | ICD-10-CM

## 2016-01-31 DIAGNOSIS — R1013 Epigastric pain: Secondary | ICD-10-CM

## 2016-01-31 DIAGNOSIS — R000 Tachycardia, unspecified: Secondary | ICD-10-CM

## 2016-01-31 DIAGNOSIS — I10 Essential (primary) hypertension: Secondary | ICD-10-CM

## 2016-01-31 LAB — POCT INFLUENZA A/B
INFLUENZA A, POC: NEGATIVE
INFLUENZA B, POC: NEGATIVE

## 2016-01-31 MED ORDER — FLUTICASONE PROPIONATE 50 MCG/ACT NA SUSP: 1.0000 | Freq: Two times a day (BID) | NASAL | 3 refills | Status: DC

## 2016-01-31 MED ORDER — AMOXICILLIN-POT CLAVULANATE 875-125 MG PO TABS: 1.0000 | ORAL_TABLET | Freq: Two times a day (BID) | ORAL | 0 refills | Status: DC

## 2016-01-31 NOTE — Progress Notes (Signed)
Pre visit review using our clinic review tool, if applicable. No additional management support is needed unless otherwise documented below in the visit note. 

## 2016-01-31 NOTE — Progress Notes (Signed)
HPI:  ACUTE VISIT:  Chief Complaint  Patient presents with  . Cough  . Fever  . Tachycardia    [name] is a 30 y.o. male, who is here today complaining of 2-3 days of respiratory symptoms.  Tuesday (01/29/16) around 4 pm he started with chills, myalgias, and fatigue. Felt better yesterday morning but last night fell bad again.  Non productive non productive cough. + Earache, facial pain, and "bad" frontal headache.   + Nasal congestion, rhinorrhea,sore throat, and post nasal drainage.  Denies chest pain, dyspnea, or wheezing.   + Nausea with food intake, which has been worse since recent illness but has had for a coupe years, intermittently.  Denies vomiting, changes in bowel habits, or blood in stool. Hx of IBS and abdominal pain, RUQ mainly, stable.  No Hx of recent travel. Sick contact: Son had croup. No known insect bite. + Hx of allergies.  OTC medications for this problem: Ibuprofen,cold medications. Symptoms otherwise stable.  S/P back surgery, he had follow up today and recovering well from procedure. Back pain has greatly improved, still has some with certain movements but in general very satisfied with surgery outcome.   -Also c/o what he calls "panic attacks", for a while, usually in the morning and exacerbated by food intake: Nausea, palpitation, RUQ pain, feels full after a few bites of food, and acid reflux; sometimes associated with diaphoresis. Denies associated chest pain or dyspnea.  Hx of GERD, Dexilant helps with heartburn but does not help much with other symptoms,he has not taken it for a week. EGD 03/2014 erosive esophagitis.    HTN: He is not longer on lisinopril. According to patient, he presented to ER a few months ago because RUQ abdominal pain and Dx with pancreatitis.Lisinopril was thought to be the causal agent , so recommended stopping it, he was instructed to follow, medication was not replaced for another  antihypertensive. He tells me that his mother also developed pancreatitis while she was on Lisinopril. He is not taking BP at home.  He takes Ibuprofen frequently. + Smoker, trying to decreased cig/day.   Lab Results  Component Value Date   LIPASE 16 04/16/2015   Lab Results  Component Value Date   WBC 9.7 01/09/2016   HGB 16.9 01/09/2016   HCT 49.3 01/09/2016   MCV 86.8 01/09/2016   PLT 212 01/09/2016   Lab Results  Component Value Date   ALT 36 10/04/2015   AST 22 10/04/2015   ALKPHOS 54 10/04/2015   BILITOT 0.4 10/04/2015   Lab Results  Component Value Date   CREATININE 0.89 01/09/2016   BUN 19 01/09/2016   NA 139 01/09/2016   K 4.4 01/09/2016   CL 106 01/09/2016   CO2 25 01/09/2016     Review of Systems  Constitutional: Positive for appetite change, chills, fatigue and fever. Negative for activity change.  HENT: Positive for congestion, ear pain, rhinorrhea, sinus pain and sore throat. Negative for mouth sores, nosebleeds, postnasal drip, sneezing, trouble swallowing and voice change.   Eyes: Positive for discharge (epiphora). Negative for redness, itching and visual disturbance.  Respiratory: Positive for cough. Negative for chest tightness, shortness of breath and wheezing.   Cardiovascular: Positive for palpitations. Negative for chest pain and leg swelling.  Gastrointestinal: Positive for nausea. Negative for abdominal distention, blood in stool, diarrhea and vomiting.  Genitourinary: Negative for dysuria and hematuria.  Musculoskeletal: Positive for myalgias. Negative for gait problem and neck pain.  Skin:  Negative for color change and rash.  Neurological: Positive for headaches. Negative for syncope and weakness.  Hematological: Negative for adenopathy. Does not bruise/bleed easily.  Psychiatric/Behavioral: Negative for confusion. The patient is nervous/anxious.       Current Outpatient Prescriptions on File Prior to Visit  Medication Sig Dispense  Refill  . cyclobenzaprine (FLEXERIL) 10 MG tablet Take 1 tablet (10 mg total) by mouth 3 (three) times daily as needed for muscle spasms. 30 tablet 0  . dexlansoprazole (DEXILANT) 60 MG capsule Take 60 mg by mouth daily.    . Multiple Vitamin (MULTIVITAMIN WITH MINERALS) TABS tablet Take 1 tablet by mouth daily.    . traMADol (ULTRAM) 50 MG tablet Take 1 tablet (50 mg total) by mouth every 6 (six) hours as needed. 40 tablet 0   No current facility-administered medications on file prior to visit.      Past Medical History:  Diagnosis Date  . Asthma    as a child  . Blood in stool   . Chronic back pain   . Colon polyps 2015  . GERD (gastroesophageal reflux disease)   . History of kidney stones    2 - passed  . Hyperplastic colon polyp   . Hypertension    HTN- medication started 10/2015- patient stopped 1 week later  . IBS (irritable bowel syndrome)   . Neuromuscular disorder (HCC)    Allergies  Allergen Reactions  . Peanut-Containing Drug Products Hives  . Zestoretic [Lisinopril-Hydrochlorothiazide] Other (See Comments)    Abdominal pain     Social History   Social History  . Marital status: Married    Spouse name: N/A  . Number of children: N/A  . 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 Smoking status: Current Every Day Smoker    Packs/day: 1.00    Years: 21.00    Types: Cigarettes    Last attempt to quit: 09/11/2013  . Smokeless tobacco: Never Used  . Alcohol use 0.6 oz/week    1 Cans of beer per week  . Drug use: No  . Sexual activity: Not Asked   Other Topics Concern  . None   Social History Narrative  . None    Vitals:   01/31/16 1518  BP: (!) 148/80  Pulse: (!) 120  Resp: 16  Temp: 99.6 F (37.6 C)    O2 sat at RA 99%  Body mass index is 47.11 kg/m.    Physical Exam  Nursing note and vitals reviewed. Constitutional: He is oriented to person, place, and time.  He appears well-developed. He does not appear ill. No distress.  HENT:  Head: Atraumatic.  Right Ear: External ear and ear canal normal. Tympanic membrane is not bulging. A middle ear effusion is present.  Left Ear: Tympanic membrane, external ear and ear canal normal.  Nose: Rhinorrhea present. Right sinus exhibits maxillary sinus tenderness and frontal sinus tenderness. Left sinus exhibits maxillary sinus tenderness and frontal sinus tenderness.  Mouth/Throat: Uvula is midline and mucous membranes are normal. Posterior oropharyngeal erythema present. No oropharyngeal exudate, posterior oropharyngeal edema or tonsillar abscesses.  Left TM minimal erythema on upper/anterior aspect. Hypertrophic turbinates.  Eyes: Conjunctivae and EOM are normal. Pupils are equal, round, and reactive to light.  Neck: No muscular tenderness present. No neck rigidity. No edema present.  Cardiovascular: Regular rhythm.  Tachycardia present.   No murmur heard. Pulses:  Dorsalis pedis pulses are 2+ on the right side, and 2+ on the left side.  Respiratory: Effort normal and breath sounds normal. No stridor. No respiratory distress.  GI: Soft. He exhibits no mass. There is no hepatomegaly. There is no tenderness.  Musculoskeletal: He exhibits no edema or tenderness.  Lymphadenopathy:       Head (right side): No submandibular adenopathy present.       Head (left side): No submandibular adenopathy present.    He has cervical adenopathy.       Right cervical: Posterior cervical adenopathy present.       Left cervical: Posterior cervical adenopathy present.  Neurological: He is alert and oriented to person, place, and time. He has normal strength. No cranial nerve deficit. Coordination and gait normal.  Skin: Skin is warm. No rash noted. No erythema.  Lower and mid back scar s/p discectomy healing well  Psychiatric: His speech is normal. His mood appears anxious.  Well groomed, good eye contact.       ASSESSMENT AND PLAN:     Antonio Griffith was seen today for cough, fever and tachycardia.  Diagnoses and all orders for this visit:    Fever, unspecified fever cause  We discussed possible causes, most likely related to a viral illness. Continue monitoring blood pressure, Tylenol 500 mg every 6 hours recommended, adequate hydration, and rest.  -     POCT Influenza A/B  Sinus tachycardia  EKG: Sinus tachycardia, RAD, no signs of acute ischemia. Compared with EKG 04/2015 and 10/2014 no significant changes, except for sinus tach. Adequate hydration, avoid OTC cold medications/decongestants. Continue monitoring heart rate,, clearly instructed about warning signs. Follow-up in 7-10 days.  -     EKG 12-Lead  Acute non-recurrent sinusitis, unspecified location  Explained that he could be a vital illness in which case antibiotics treatment will not help. Because facial pain, which he describes as severe and positive sinus pain with pressure abx was recommended. Flonase nasal spray and OTC antihistaminic may also help with symptoms.  We discussed some side effects of Augmentin.   -     amoxicillin-clavulanate (AUGMENTIN) 875-125 MG tablet; Take 1 tablet by mouth 2 (two) times daily. -     fluticasone (FLONASE) 50 MCG/ACT nasal spray; Place 1 spray into both nostrils 2 (two) times daily.   Essential hypertension, benign  Continue monitoring BP at home. For now he will continue nonpharmacologic treatment. Instructed about warning signs. F/U in 7-10 days.  Explained that cases of pancreatitis have bee reported with Lisinopril, about 1%, it is not a very common one. I added Lisinopril in 10/2015 because poorly controlled HTN. He has several GI encounters, many addressing RUQ pain, I did not find comment about pancreatitis furthermore there is not GI visits after Lisinopril was started. I noted that he was c/o symptoms he is reporting today (nausea, acid reflux,RUQ pain, and early  satiety). He has had cardiac work up done and reported as negative. Hx of elevated transaminases and fatty liver, s/p cholecystectomy.  Reviewing records I cannot find documentation about recent Dx of pancreatitis, had lipase done last in 04/2015, mildly elevated 10/2014. He prior he has had abdominal CT done  (01/2014, 04/18/15, 09/2015) because years Hx of RUQ pain. 09/26/15 abdominal CT: Fatty infiltration of the liver. Prior cholecystectomy.No acute findings in the abdomen or pelvis.   Dyspepsia Resume Dexilant. GERD precautions discussed. Wt loss, smoking cessation, and avoidance of NSAID's recommended.  EGD 04/2015:Couple of tiny gastric erosions otherwise normal EGD  Return in about 10 days (around 02/10/2016) for HTN,tachy.     -[name] was advised to return or notify a doctor immediately if symptoms worsen or new concerns arise, he voices understanding.       Antonio Griffith G. Martinique, MD  [facility]. [place] office.

## 2016-01-31 NOTE — Patient Instructions (Signed)
A few things to remember from today's visit:   Sinus tachycardia - Plan: EKG 12-Lead  Fever, unspecified fever cause - Plan: POCT Influenza A/B  Essential hypertension, benign  Acute non-recurrent sinusitis, unspecified location  If it is a viral infections it is self-limited because persistent fever I am recommending antibiotic but may not help.   Tylenol  also helps with most symptoms (headache, muscle aching, fever,etc) Plenty of fluids. Honey helps with cough. Steam inhalations helps with runny nose, nasal congestion, and may prevent sinus infections. Cough and nasal congestion could last a few days and sometimes weeks. Please follow in not any better in 1-2 weeks or if symptoms get worse.  Monitor blood pressure and pulse. If headache worse or other warning signs we discussed go to ER.   Please be sure medication list is accurate. If a new problem present, please set up appointment sooner than planned today.

## 2016-02-01 ENCOUNTER — Telehealth: Payer: Self-pay | Admitting: Internal Medicine

## 2016-02-01 NOTE — Telephone Encounter (Signed)
Pt called to see if he could get samples of Dexilant and Linzess. 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  I told him that we were out of Dexilant samples, but would have the nurse check on samples of Linzess. Please advise [phone]

## 2016-02-04 ENCOUNTER — Ambulatory Visit (INDEPENDENT_AMBULATORY_CARE_PROVIDER_SITE_OTHER): Payer: 59 | Admitting: Family Medicine

## 2016-02-04 ENCOUNTER — Telehealth: Payer: Self-pay | Admitting: Family Medicine

## 2016-02-04 ENCOUNTER — Encounter: Payer: Self-pay | Admitting: Family Medicine

## 2016-02-04 VITALS — BP 110/70 | HR 105 | Temp 98.2°F | Resp 12 | Ht 69.0 in | Wt 322.0 lb

## 2016-02-04 DIAGNOSIS — I8393 Asymptomatic varicose veins of bilateral lower extremities: Secondary | ICD-10-CM | POA: Diagnosis not present

## 2016-02-04 DIAGNOSIS — N50819 Testicular pain, unspecified: Secondary | ICD-10-CM

## 2016-02-04 DIAGNOSIS — R000 Tachycardia, unspecified: Secondary | ICD-10-CM | POA: Diagnosis not present

## 2016-02-04 DIAGNOSIS — R600 Localized edema: Secondary | ICD-10-CM

## 2016-02-04 LAB — POC URINALSYSI DIPSTICK (AUTOMATED)
Blood, UA: NEGATIVE
Glucose, UA: NEGATIVE
LEUKOCYTES UA: NEGATIVE
NITRITE UA: NEGATIVE
PH UA: 5.5
Spec Grav, UA: 1.015
UROBILINOGEN UA: 1

## 2016-02-04 NOTE — Telephone Encounter (Signed)
No dexilant samples available. #3 boxes of linzess 238mcg are at the front desk. Tried to call pt- [phone]

## 2016-02-04 NOTE — Progress Notes (Signed)
Pre visit review using our clinic review tool, if applicable. No additional management support is needed unless otherwise documented below in the visit note. 

## 2016-02-04 NOTE — Patient Instructions (Addendum)
A few things to remember from today's visit:   Lower extremity edema - Plan: VAS [place] LOWER EXTREMITY VENOUS (DVT), CBC w/Diff, Basic metabolic panel, POCT Urinalysis Dipstick (Automated)  Sinus tachycardia - Plan: CBC w/Diff  Stop antibiotic. Lower extremities elevation.  Plenty of fluids.  Keep next follow up appointment.  Go to ER if sudden shortness of breath, chest pain, clammy sensation, severe headache among some other warnig signs we discussed.  Please be sure medication list is accurate. If a new problem present, please set up appointment sooner than planned today.

## 2016-02-04 NOTE — Progress Notes (Signed)
HPI:   Antonio Griffith is a 30 y.o. male, who is here today to follow on recent ER visit and new problem.  I saw him 01/31/16 for sinus tachycardia,headache,fever, and body aches, treated for acute sinusitis with Augmentin. He is reporting greatly improvement of symptoms, he is not longer having fever. His BP was also elevated, reporting that BP and HR have improved but mentions that his HR during recent ER visit was 150's.    On 02/01/16 he started with nausea,vomiting, and RUQ pain. He went to the ER [facility] ER) and according to patient, he was Dx with severe constipation and UTI. According to pt,he was recommended continuing with Augmentin.   He is also is c/o lower extremities edema,R>>L, involving genital area. According to patient, yesterday morning she noted erythema and pain of "whole" RLE, later he started noticing left ankle and foot edema as well as testicular edema. Also having mild testicular soreness; he denies any mass, urethral discharge, or dysuria. She denies any history of injury and has not noted rash or ecchymosis.   He attributes all these symptoms to Augmentin. Stopped Augmenting last night.  Genital edema has improved. He denies gross hematuria, or foam in the urine, or decreased urine output. He is having some intermittently mild numbness sensation on lower extremities, mainly at night when laying down and alleviated by changing positions. He denies cold extremity, cyanosis, or claudication.  He states that he is  having tenderness "to touch from waist down."  He is not longer having fever, chills, or body aches. Still having bitemporal headache. He denies visual changes, focal weakness, dysphagia. Nausea, vomiting, and abdominal pain have resolved. Last bowel movement this morning, denies blood in stool or melena.  He has history of varicose veins and has had superficial phlebitis in the past.     Review of Systems  Constitutional:  Positive for fatigue. Negative for activity change, appetite change, fever and unexpected weight change.  HENT: Negative for facial swelling, nosebleeds, sore throat and trouble swallowing.   Eyes: Negative for pain, redness and visual disturbance.  Respiratory: Positive for cough. Negative for shortness of breath and wheezing.   Cardiovascular: Positive for leg swelling. Negative for chest pain and palpitations.  Gastrointestinal: Negative for abdominal pain, nausea and vomiting.  Genitourinary: Positive for scrotal swelling and testicular pain. Negative for decreased urine volume, difficulty urinating, discharge, dysuria, flank pain and hematuria.  Musculoskeletal: Negative for gait problem and myalgias.  Skin: Negative for color change and rash.  Neurological: Positive for numbness and headaches. Negative for seizures, syncope and weakness.  Hematological: Negative for adenopathy. Does not bruise/bleed easily.  Psychiatric/Behavioral: Negative for confusion. The patient is nervous/anxious.       Current Outpatient Prescriptions on File Prior to Visit  Medication Sig Dispense Refill  . Multiple Vitamin (MULTIVITAMIN WITH MINERALS) TABS tablet Take 1 tablet by mouth daily.    Marland Kitchen dexlansoprazole (DEXILANT) 60 MG capsule Take 60 mg by mouth daily.     No current facility-administered medications on file prior to visit.      Past Medical History:  Diagnosis Date  . Asthma    as a child  . Blood in stool   . Chronic back pain   . Colon polyps 2015  . GERD (gastroesophageal reflux disease)   . History of kidney stones    2 - passed  . Hyperplastic colon polyp   . Hypertension    HTN- medication started 10/2015- patient stopped  1 week later  . IBS (irritable bowel syndrome)   . Neuromuscular disorder (HCC)    Allergies  Allergen Reactions  . Peanut-Containing Drug Products Hives  . Zestoretic [Lisinopril-Hydrochlorothiazide] Other (See Comments)    Abdominal pain     Social  History   Social History  . Marital status: Married    Spouse name: N/A  . Number of children: N/A  . 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 Smoking status: Current Every Day Smoker    Packs/day: 1.00    Years: 21.00    Types: Cigarettes    Last attempt to quit: 09/11/2013  . Smokeless tobacco: Never Used  . Alcohol use 0.6 oz/week    1 Cans of beer per week  . Drug use: No  . Sexual activity: Not Asked   Other Topics Concern  . None   Social History Narrative  . None    Vitals:   02/04/16 1508  BP: 110/70  Pulse: (!) 105  Resp: 12  Temp: 98.2 F (36.8 C)    O2 sat 96% at RA.  Body mass index is 47.55 kg/m.    Physical Exam  Nursing note and vitals reviewed. Constitutional: He is oriented to person, place, and time. He appears well-developed. He does not appear ill. No distress.  HENT:  Head: Atraumatic.  Mouth/Throat: Oropharynx is clear and moist and mucous membranes are normal.  Eyes: Conjunctivae and EOM are normal. Pupils are equal, round, and reactive to light.  Neck: No JVD present.  Cardiovascular: Regular rhythm.  Tachycardia present.   No murmur heard. Pulses:      Dorsalis pedis pulses are 2+ on the right side, and 2+ on the left side.  Varicose veins, moderate R>L  Respiratory: Effort normal and breath sounds normal. No respiratory distress.  GI: Soft. He exhibits no mass. There is no hepatomegaly. There is no tenderness. Hernia confirmed negative in the right inguinal area and confirmed negative in the left inguinal area.  Genitourinary:  Genitourinary Comments: Symmetric testicles, no scrotal edema, no masses, and no hernias appreicated.  Musculoskeletal: He exhibits edema (RLE 2+ pitting edema, no calf tenderenss, + local heat, tight. LLE trace pitting edema, no local heat).  Holman's test negative bilateral. No tenderness upon palpation of calves, right mildly  tight. No tenderness upon palpation of right thigh and no edema or erythema appreciated.  Lymphadenopathy:       Right: No inguinal adenopathy present.       Left: No inguinal adenopathy present.  Neurological: He is alert and oriented to person, place, and time. He has normal strength. No cranial nerve deficit. Coordination and gait normal.  Skin: Skin is warm. No ecchymosis and no rash noted. There is erythema.  RLE with mild erythema on pretibial area, no tenderness, + local heat, no induration.  Psychiatric: His mood appears anxious. Cognition and memory are normal.  Well groomed, good eye contact.      ASSESSMENT AND PLAN:     Dameon was seen today for follow-up and edema.  Diagnoses and all orders for this visit:   Lower extremity edema  We discussed possible causes. Given his Hx of recent surgery + smoking+ Hx of superficial phlebitis, we need to evaluate for DVT.       We could not arrange Doppler ultrasound of lower extremities stat, so we recommend patient going to the  ER now but he refused. He voices        understanding of warning signs. He will have Doppler ultrasound tomorrow morning.  I am not sure if all his complaints today are related to Augmentin.          Elevation of LE above heart level recommended.  -     VAS [place] LOWER EXTREMITY VENOUS (DVT); Future -     CBC w/Diff -     Basic metabolic panel -     POCT Urinalysis Dipstick (Automated)  Sinus tachycardia  Improved. BP better controlled. In general he states that he is feeling better. Instructed about warning signs.  -     CBC w/Diff  Varicose veins of both lower extremities  Could be contributing to LE edema, ? Venous stasis. LE elevation, compression stocking, and wt loss may help.   Testicular pain, unspecified  Improved. Today testicular exam I didn't appreciate edema, mild bilateral tenderness, no masses. For now he will monitor. Keep follow up appt.   At the time of his visit I  do not have records of recent ER visit. Nausea, vomiting, and abdominal pain resolved. Hx of IBS-constipation and GERD, has followed with GI.   -Mr. Ramar Grass was advised to return sooner than planned today if new concerns arise.       Charnice Zwilling G. Martinique, MD  [facility]. [place] office.

## 2016-02-04 NOTE — Telephone Encounter (Signed)
Noted  

## 2016-02-04 NOTE — Telephone Encounter (Signed)
Patient Name: Antonio Griffith Patient Name: Antonio Griffith DOB: [birth year] Initial Comment Caller states he has been on Amoxicillin for three days. He has swelling in testicals, knees, legs, and feet. Dr put him on antibiotic for flu like symtoms. He also has a bladder infection. Nurse Assessment Nurse: Ronnald Ramp, RN, Miranda Date/Time (Eastern Time): 02/04/2016 9:30:13 AM Confirm and document reason for call. If symptomatic, describe symptoms. ---Caller states he started having abdominal pain on Friday night and went to the ED. Diagnosed with severe constipation related to pain medication. They also said he had UTI. He has been on antibiotics for URI since Thursday night. Saturday he started having swelling in his feet that has spread up his legs to his waist, including his testicule. Does the patient have any new or worsening symptoms? ---Yes Will a triage be completed? ---Yes Related visit to physician within the last 2 weeks? ---Yes Does the PT have any chronic conditions? (i.e. diabetes, asthma, etc.) ---Yes List chronic conditions. ---IBS-C, GERD Is this a behavioral health or substance abuse call? ---No Guidelines Guideline Title Affirmed Question Affirmed Notes Leg Swelling and Edema SEVERE leg swelling (e.g., swelling extends above knee, entire leg is swollen, weeping fluid) Final Disposition User See Physician within 4 Hours (or PCP triage) Ronnald Ramp, RN, Miranda Comments Office has already scheduled pt to be seen with PCP at 3pm. Referrals REFERRED TO PCP OFFICE Disagree/Comply: Comply

## 2016-02-05 ENCOUNTER — Telehealth: Payer: Self-pay

## 2016-02-05 ENCOUNTER — Ambulatory Visit (HOSPITAL_COMMUNITY)
Admission: RE | Admit: 2016-02-05 | Discharge: 2016-02-05 | Disposition: A | Discharge status: Home / Self Care | Payer: 59 | Source: Ambulatory Visit | Attending: Family Medicine | Admitting: Family Medicine

## 2016-02-05 ENCOUNTER — Telehealth: Payer: Self-pay | Admitting: Family Medicine

## 2016-02-05 DIAGNOSIS — R600 Localized edema: Secondary | ICD-10-CM | POA: Insufficient documentation

## 2016-02-05 DIAGNOSIS — I82811 Embolism and thrombosis of superficial veins of right lower extremities: Secondary | ICD-10-CM | POA: Insufficient documentation

## 2016-02-05 DIAGNOSIS — R599 Enlarged lymph nodes, unspecified: Secondary | ICD-10-CM | POA: Insufficient documentation

## 2016-02-05 LAB — CBC WITH DIFFERENTIAL/PLATELET
BASOS ABS: 0 10*3/uL (ref 0.0–0.1)
Basophils Relative: 0.2 % (ref 0.0–3.0)
EOS ABS: 0 10*3/uL (ref 0.0–0.7)
Eosinophils Relative: 1 % (ref 0.0–5.0)
HEMATOCRIT: 40.2 % (ref 39.0–52.0)
HEMOGLOBIN: 13.9 g/dL (ref 13.0–17.0)
LYMPHS PCT: 23.9 % (ref 12.0–46.0)
Lymphs Abs: 1 10*3/uL (ref 0.7–4.0)
MCHC: 34.7 g/dL (ref 30.0–36.0)
MCV: 85 fl (ref 78.0–100.0)
Monocytes Absolute: 0.2 10*3/uL (ref 0.1–1.0)
Monocytes Relative: 5.5 % (ref 3.0–12.0)
Neutro Abs: 2.9 10*3/uL (ref 1.4–7.7)
Neutrophils Relative %: 69.4 % (ref 43.0–77.0)
Platelets: 156 10*3/uL (ref 150.0–400.0)
RBC: 4.73 Mil/uL (ref 4.22–5.81)
RDW: 13.5 % (ref 11.5–15.5)
WBC: 4.2 10*3/uL (ref 4.0–10.5)

## 2016-02-05 LAB — BASIC METABOLIC PANEL
BUN: 15 mg/dL (ref 6–23)
CHLORIDE: 100 meq/L (ref 96–112)
CO2: 31 mEq/L (ref 19–32)
Calcium: 8.9 mg/dL (ref 8.4–10.5)
Creatinine, Ser: 0.98 mg/dL (ref 0.40–1.50)
GFR: 94.84 mL/min (ref >60.00)
GLUCOSE: 152 mg/dL — AB (ref 70–99)
POTASSIUM: 3.2 meq/L — AB (ref 3.5–5.1)
Sodium: 140 mEq/L (ref 135–145)

## 2016-02-05 NOTE — Progress Notes (Signed)
*  PRELIMINARY RESULTS* Vascular Ultrasound Right lower extremity venous duplex has been completed.  Preliminary findings: no evidence of DVT. Superficial thrombosis noted in varicose veins at right proximal calf (hard palpable area). Bilateral enlarged inguinal lymph nodes visualized.    Attempted call report to Judson Roch, Dr. Doug Sou nurse. Left voice message with results.   Landry Mellow, RDMS, RVT  02/05/2016, 9:18 AM

## 2016-02-05 NOTE — Telephone Encounter (Signed)
Received VM from Vascular lab.  No evidence of DVT. Superficial thrombus and varicose veins. Enlarged lymph nodes bilateral groin which could be a sign of infection.

## 2016-02-05 NOTE — Telephone Encounter (Signed)
Talked with patient patient about labs, see lab result note. Patient verbalized understanding.

## 2016-02-05 NOTE — Telephone Encounter (Signed)
Pt would like a call back about his lab results   °

## 2016-02-07 ENCOUNTER — Emergency Department (HOSPITAL_COMMUNITY)
Admission: EM | Admit: 2016-02-07 | Discharge: 2016-02-07 | Disposition: A | Discharge status: Home / Self Care | Payer: 59 | Attending: Emergency Medicine | Admitting: Emergency Medicine

## 2016-02-07 ENCOUNTER — Emergency Department (HOSPITAL_COMMUNITY): Payer: 59

## 2016-02-07 ENCOUNTER — Encounter (HOSPITAL_COMMUNITY): Payer: Self-pay | Admitting: Emergency Medicine

## 2016-02-07 DIAGNOSIS — I10 Essential (primary) hypertension: Secondary | ICD-10-CM | POA: Insufficient documentation

## 2016-02-07 DIAGNOSIS — F17210 Nicotine dependence, cigarettes, uncomplicated: Secondary | ICD-10-CM | POA: Insufficient documentation

## 2016-02-07 DIAGNOSIS — R0602 Shortness of breath: Secondary | ICD-10-CM | POA: Insufficient documentation

## 2016-02-07 DIAGNOSIS — J45909 Unspecified asthma, uncomplicated: Secondary | ICD-10-CM | POA: Insufficient documentation

## 2016-02-07 DIAGNOSIS — R609 Edema, unspecified: Secondary | ICD-10-CM | POA: Insufficient documentation

## 2016-02-07 DIAGNOSIS — Z79899 Other long term (current) drug therapy: Secondary | ICD-10-CM | POA: Insufficient documentation

## 2016-02-07 LAB — CBC WITH DIFFERENTIAL/PLATELET
Basophils Absolute: 0 10*3/uL (ref 0.0–0.1)
Basophils Relative: 0 %
Eosinophils Absolute: 0.2 10*3/uL (ref 0.0–0.7)
Eosinophils Relative: 3 %
HCT: 40 % (ref 39.0–52.0)
Hemoglobin: 13.6 g/dL (ref 13.0–17.0)
Lymphocytes Relative: 27 %
Lymphs Abs: 1.7 10*3/uL (ref 0.7–4.0)
MCH: 29.6 pg (ref 26.0–34.0)
MCHC: 34 g/dL (ref 30.0–36.0)
MCV: 87.1 fL (ref 78.0–100.0)
Monocytes Absolute: 0.5 10*3/uL (ref 0.1–1.0)
Monocytes Relative: 8 %
Neutro Abs: 3.9 10*3/uL (ref 1.7–7.7)
Neutrophils Relative %: 62 %
Platelets: 279 10*3/uL (ref 150–400)
RBC: 4.59 MIL/uL (ref 4.22–5.81)
RDW: 13.1 % (ref 11.5–15.5)
WBC: 6.3 10*3/uL (ref 4.0–10.5)

## 2016-02-07 LAB — BASIC METABOLIC PANEL
Anion gap: 5 (ref 5–15)
BUN: 13 mg/dL (ref 6–20)
CO2: 30 mmol/L (ref 22–32)
Calcium: 8.7 mg/dL — ABNORMAL LOW (ref 8.9–10.3)
Chloride: 102 mmol/L (ref 101–111)
Creatinine, Ser: 0.76 mg/dL (ref 0.61–1.24)
GFR calc Af Amer: >60 mL/min (ref >60)
GFR calc non Af Amer: >60 mL/min (ref >60)
Glucose, Bld: 130 mg/dL — ABNORMAL HIGH (ref 65–99)
Potassium: 3 mmol/L — ABNORMAL LOW (ref 3.5–5.1)
Sodium: 137 mmol/L (ref 135–145)

## 2016-02-07 LAB — TROPONIN I: Troponin I: <0.03 ng/mL (ref <0.03)

## 2016-02-07 LAB — BRAIN NATRIURETIC PEPTIDE: B Natriuretic Peptide: 31 pg/mL (ref 0.0–100.0)

## 2016-02-07 MED ORDER — FUROSEMIDE 20 MG PO TABS: 20.0000 mg | ORAL_TABLET | Freq: Two times a day (BID) | ORAL | 0 refills | Status: AC

## 2016-02-07 MED ORDER — POTASSIUM CHLORIDE CRYS ER 20 MEQ PO TBCR: 20.0000 meq | EXTENDED_RELEASE_TABLET | Freq: Two times a day (BID) | ORAL | 0 refills | Status: AC

## 2016-02-07 MED ORDER — POTASSIUM CHLORIDE CRYS ER 20 MEQ PO TBCR
60.0000 meq | EXTENDED_RELEASE_TABLET | Freq: Once | ORAL | Status: AC
  Administered 2016-02-07: 60 meq via ORAL
  Filled 2016-02-07: qty 3

## 2016-02-07 NOTE — ED Provider Notes (Signed)
[facility] DEPT Provider Note   CSN: [id number] Arrival date & time: 02/07/16  1241     History   Chief Complaint Chief Complaint  Patient presents with  . Leg Swelling    HPI Antonio Griffith is a 30 y.o. male.  HPI   15yM with LE swelling. Increasing since recent lower thoracic surgery. Was having scrota swelling as well which has improved, but leg have not. He has had [place] which is negative for DVT. No rash. Some numbness in both feet. No loss of strength. No bladder/bowel incontinence.   Past Medical History:  Diagnosis Date  . Asthma    as a child  . Blood in stool   . Chronic back pain   . Colon polyps 2015  . GERD (gastroesophageal reflux disease)   . History of kidney stones    2 - passed  . Hyperplastic colon polyp   . Hypertension    HTN- medication started 10/2015- patient stopped 1 week later  . IBS (irritable bowel syndrome)   . Neuromuscular disorder [facility])     Patient Active Problem List   Diagnosis Date Noted  . Varicose veins of both lower extremities 02/04/2016  . HNP (herniated nucleus pulposus with myelopathy), thoracic 01/09/2016  . IBS (irritable bowel syndrome) 10/04/2015  . Lumbar back pain with radiculopathy affecting right lower extremity 10/04/2015  . Essential hypertension, benign 10/04/2015  . Mucosal abnormality of stomach   . Dyspepsia 04/16/2015  . History of colonic polyps   . Rectal bleeding 03/14/2014  . Abdominal pain 03/14/2014  . Chronic constipation 03/14/2014  . Diarrhea 03/14/2014  . GERD (gastroesophageal reflux disease) 03/14/2014    Past Surgical History:  Procedure Laterality Date  . CHOLECYSTECTOMY  12/26/13  . COLONOSCOPY  11/2013   Polypectomy x 4  . COLONOSCOPY  2014   Bowel perforated  . ESOPHAGOGASTRODUODENOSCOPY  11/2013  . ESOPHAGOGASTRODUODENOSCOPY N/A 03/29/2014   [id number] reflux esophagitis.   Marland Kitchen ESOPHAGOGASTRODUODENOSCOPY N/A 04/19/2015   Procedure: ESOPHAGOGASTRODUODENOSCOPY (EGD);  Surgeon: Daneil Dolin, MD;  Location: [facility];  Service: Endoscopy;  Laterality: N/A;  pt knows to arrive at 1:30  . FLEXIBLE SIGMOIDOSCOPY N/A 03/29/2014   Dr.Rourk- Anal canal internal hemorrhoids, likely source of paper hematochezia. Diminutive rectal and sigmoid polyps were removed bx= hyperplastic polyps  . GALLBLADDER SURGERY    . KNEE SURGERY Right    Age 37- arthroscopy       Home Medications    Prior to Admission medications   Medication Sig Start Date End Date Taking? Authorizing Provider  dexlansoprazole (DEXILANT) 60 MG capsule Take 60 mg by mouth daily.    Historical Provider, MD  Multiple Vitamin (MULTIVITAMIN WITH MINERALS) TABS tablet Take 1 tablet by mouth daily.    Historical Provider, MD    Family History Family History  Problem Relation Age of Onset  . Hypertension Mother   . Hypertension Father   . Colon cancer Neg Hx     Social History Social History  Substance Use Topics  . Smoking status: Current Every Day Smoker    Packs/day: 1.00    Years: 21.00    Types: Cigarettes    Last attempt to quit: 09/11/2013  . Smokeless tobacco: Never Used  . Alcohol use 0.6 oz/week    1 Cans of beer per week     Allergies   Peanut-containing drug products and Zestoretic [lisinopril-hydrochlorothiazide]   Review of Systems Review of Systems  All systems reviewed and negative, other than as noted  in HPI.   Physical Exam Updated Vital Signs BP (!) 162/104 (BP Location: Left Arm)   Pulse 106   Temp 98.6 F (37 C) (Temporal)   Resp 18   Ht 5\' 9"  (1.753 m)   Wt (!) 319 lb (144.7 kg)   SpO2 100%   BMI 47.11 kg/m   Physical Exam  Constitutional: He appears well-developed and well-nourished. No distress.  HENT:  Head: Normocephalic and atraumatic.  Eyes: Conjunctivae are normal. Right eye exhibits no discharge. Left eye exhibits no discharge.  Neck: Neck supple.  Cardiovascular: Normal rate, regular rhythm and normal heart sounds.  Exam reveals no gallop and no  friction rub.   No murmur heard. Pulmonary/Chest: Effort normal and breath sounds normal. No respiratory distress.  Abdominal: Soft. He exhibits no distension. There is no tenderness.  Musculoskeletal: He exhibits edema. He exhibits no tenderness.  Neurological: He is alert.  Skin: Skin is warm and dry.  Psychiatric: He has a normal mood and affect. His behavior is normal. Thought content normal.  Nursing note and vitals reviewed.    ED Treatments / Results  Labs (all labs ordered are listed, but only abnormal results are displayed) Labs Reviewed - No data to display  EKG  EKG Interpretation None       Radiology No results found.  Procedures Procedures (including critical care time)  Medications Ordered in ED Medications - No data to display   Initial Impression / Assessment and Plan / ED Course  I have reviewed the triage vital signs and the nursing notes.  Pertinent labs & imaging results that were available during my care of the patient were reviewed by me and considered in my medical decision making (see chart for details).  Clinical Course     30 year old male with persistent peripheral lower extremity edema. Recent ultrasound negative for DVT. No signs of heart failure. I received a call from Dr. Saintclair Halsted, neurosurgery. He is requesting an MR by the patient's lower T-spine and lumbar spine with and without contrast. Unfortunately, he is too large for our MRI machine here. Outpt MRI scheduled [facility]. He is in NAD and I feel he is appropriate for discharge to receive the scan as an outpt. Will give a tiral of lasix.   It has been determined that no acute conditions requiring further emergency intervention are present at this time. The patient has been advised of the diagnosis and plan. I reviewed any labs and imaging including any potential incidental findings. We have discussed signs and symptoms that warrant return to the ED and they are listed in the  discharge instructions.    Final Clinical Impressions(s) / ED Diagnoses   Final diagnoses:  Peripheral edema    New Prescriptions New Prescriptions   No medications on file     [name], MD 02/10/16 1559

## 2016-02-07 NOTE — ED Triage Notes (Signed)
Pt had back surgery in Nov, has been hospitalized for pain in his R side recently. Pt had a bladder infection and was on abx but started having leg and testicular edema. Pt was taken off the abx and reports the edema in his testicles has decreased but his feet and legs are still swollen. Pt reports his back surgeon requested he come to the ED for an MRI of his back.

## 2016-02-07 NOTE — Discharge Instructions (Signed)
Unfortunately, our MR machine at [facility] accommodate you. We are going to try to arrange for your to have one at [facility].

## 2016-02-11 ENCOUNTER — Ambulatory Visit: Payer: Self-pay | Admitting: Family Medicine

## 2016-02-13 ENCOUNTER — Ambulatory Visit
Admission: RE | Admit: 2016-02-13 | Discharge: 2016-02-13 | Disposition: A | Discharge status: Home / Self Care | Payer: Worker's Compensation | Source: Ambulatory Visit | Attending: Emergency Medicine | Admitting: Emergency Medicine

## 2016-02-15 ENCOUNTER — Ambulatory Visit: Payer: Self-pay | Admitting: Family Medicine

## 2016-02-15 DIAGNOSIS — Z0289 Encounter for other administrative examinations: Secondary | ICD-10-CM

## 2016-02-19 ENCOUNTER — Other Ambulatory Visit: Payer: Self-pay

## 2016-03-04 ENCOUNTER — Other Ambulatory Visit: Payer: Self-pay | Admitting: Neurosurgery

## 2016-03-04 DIAGNOSIS — M5104 Intervertebral disc disorders with myelopathy, thoracic region: Secondary | ICD-10-CM

## 2016-03-09 ENCOUNTER — Ambulatory Visit
Admission: RE | Admit: 2016-03-09 | Discharge: 2016-03-09 | Disposition: A | Discharge status: Home / Self Care | Payer: Worker's Compensation | Source: Ambulatory Visit | Attending: Neurosurgery | Admitting: Neurosurgery

## 2016-03-09 DIAGNOSIS — M5104 Intervertebral disc disorders with myelopathy, thoracic region: Secondary | ICD-10-CM

## 2016-03-09 MED ORDER — GADOBENATE DIMEGLUMINE 529 MG/ML IV SOLN
20.0000 mL | Freq: Once | INTRAVENOUS | Status: AC | PRN
  Administered 2016-03-09: 20 mL via INTRAVENOUS

## 2017-02-11 ENCOUNTER — Encounter: Payer: Self-pay | Admitting: Internal Medicine

## 2020-12-11 ENCOUNTER — Ambulatory Visit: Admit: 2020-12-11 | Discharge: 2020-12-11

## 2020-12-11 ENCOUNTER — Ambulatory Visit: Admit: 2020-12-11 | Discharge: 2020-12-11 | Payer: PRIVATE HEALTH INSURANCE | Attending: Orthopaedic Surgery

## 2020-12-11 DIAGNOSIS — M25571 Pain in right ankle and joints of right foot: Secondary | ICD-10-CM

## 2020-12-11 NOTE — Telephone Encounter (Signed)
The patient is requesting a phone call regarding a mix up with getting an appointment through his attorney, wc, for a 2nd opinion with Dr.Lamb for the right ankle. He said he had an email that the appointment was today 10/11 but I spoke with Jess and he is not scheduled for today?

## 2020-12-11 NOTE — Assessment & Plan Note (Signed)
At this point, the patient appears to have failed conservative treatment for his right ankle injury.  He continues to have significant tenderness over the anterior ankle including significant point tenderness over the deltoid and medial malleoli are nonunion.    He continues to walk with a limp and continues to require the use of an ankle brace.    At this point, I feel a cortisone injection may be helpful to address his lateral ankle pain however I think this is unlikely to help his medial ankle pain.  I continue to see a visible fracture line.  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  I feel a repeat MRI or CAT scan would be reasonable to assess the avulsion fragment and to also evaluate the ongoing lateral ankle pain.  I feel would be reasonable to proceed with surgery which would include an excision of the ununited fragment as well as a repair of the deltoid ligament.  In addition I would recommend ankle arthroscopy with debridement.    At this point, I do not believe the patient is at maximum medical improvement.  To a reasonable degree of medical certainty, I feel that surgery would improve his function and his outcome.

## 2020-12-11 NOTE — Progress Notes (Signed)
ORTHOPAEDICS    Name: Troy Mclean  Date of Birth: 05/16/1985  Gender: male  MRN: [id number]    CC: Pain ( IME: Right ankle WCOMP Injury. Previous treatment by Dr. Denyse Amass. )       HPI:   Troy Mclean is a 35 y.o. male who presents with Pain ( IME: Right ankle WCOMP Injury. Previous treatment by Dr. Denyse Amass. )  .  Troy Mclean was seen today as an IME.  He has previously seen Dr. Denyse Amass as well as Dr. Will Bonnet.  He was involved in a workplace accident on March 08, 2020.  He reports that he was climbing into a truck and got his boot stuck between the running board and the frame.  He then fell backwards and had a forced plantarflexed injury.  He was seen that day where x-rays were obtained.  I reviewed these x-rays today which show a nondisplaced medial malleolus fracture.  He was immobilized in a boot.  He has been through physical therapy 3 times.  He continues to have pain and swelling in the foot and point tenderness over the medial malleolus.Marland Kitchen    He was seen and for an IME on October 16, 2020.  He also previous had an MRI done in March 2022 which shows the area concerning for the nonunion.  He has not been able to return to work.      ROS/Meds/PSH/PMH/FH/SH:   I personally reviewed the patients standard intake form.  Below are the pertinents    Tobacco:  reports that he has been smoking cigarettes. He started smoking about 20 years ago. He has never used smokeless tobacco.  Diabetes: No No results found for: LABA1C  No results found for: EAG   Allergies   Allergen Reactions    Lisinopril Other (See Comments)    Peanut-Containing Drug Products       Past Medical History:   Diagnosis Date    High blood pressure     Sleep apnea      History reviewed. No pertinent surgical history.     Current Outpatient Medications:     ibuprofen (ADVIL;MOTRIN) 800 MG tablet, , Disp: , Rfl:     rizatriptan (MAXALT) 10 MG tablet, PLEASE SEE ATTACHED FOR DETAILED DIRECTIONS, Disp: , Rfl:     amLODIPine (NORVASC) 10 MG  tablet, 1 tablet Orally Once a day for 30 day(s), Disp: , Rfl:     gabapentin (NEURONTIN) 300 MG capsule, 1 capsule., Disp: , Rfl:     meloxicam (MOBIC) 15 MG tablet, 1 tablet Orally Once a day for 30 day(s), Disp: , Rfl:     tiZANidine (ZANAFLEX) 4 MG tablet, 1 tablet as needed Orally Three times a day, Disp: , Rfl:    Review of Systems     Physical Examination:  There were no vitals taken for this visit.   Mild antalgic gait  Well appearing male in no acute distress, alert and oriented x3, conversant with a normal affect. Responds to questions appropriately.  Appears stated age.   Head is normocephalic and atraumatic.   Pupils are equal and round bilaterally with anicteric sclera. EOMI.   Mucous membranes moist without ulceration. Oropharynx clear without lesions.  Trachea is midline without thyromegaly. Neck is supple without lymphadenopathy.   Respirations are unlabored. No distal cyanosis or clubbing.  Pulses 2+ throughout extremities with RRR.  Skin is warm, dry, and well perfused. No rashes or lesions. Brisk capillary refill.     Right  Extremity examination    Ankle tenderness to palpation over the deltoid ligament, site of medial malleoli are nonunion.  In addition tenderness noted over the anterior lateral aspect of the ankle with positive ankle impingement sign.    Right ankle motion is reduced relative to left ankle    Skin over the foot and ankle is intact.    Neutral hindfoot alignment and moderate arch height.    Negative calcaneal squeeze, negative Tinel's, no pain over the posterior tibial tendon Achilles tendon or peroneal tendons.    Negative anterior drawer.    Negative Silverskiold sign    Ankle range of motion 5 degrees dorsiflexion and 20 degrees plantar flexion. Inversion 25 eversion 15 degrees.    5/ 5 plantar flexion, dorsiflexion, inversion, eversion.    Sensation intact to light touch.        Imaging:   XR ANKLE RIGHT (MIN 3 VIEWS) (CLINIC PERFORMED)  3 views right ankle show  evidence of a medial malleoli are nonunion.  I   compared these views to the initial injury films in January.  There is no   evidence of bony bridging or callus formation.  XR FOOT RIGHT (MIN 3 VIEWS) (CLINIC PERFORMED)  3 views right foot show no acute fracture.  There is evidence on the ankle   x-rays of a nonunion of the medial malleolus       XR ANKLE RIGHT (MIN 3 VIEWS) (CLINIC PERFORMED)    Result Date: 12/11/2020  3 views right ankle show evidence of a medial malleoli are nonunion.  I compared these views to the initial injury films in January.  There is no evidence of bony bridging or callus formation.    XR FOOT RIGHT (MIN 3 VIEWS) (CLINIC PERFORMED)    Result Date: 12/11/2020  3 views right foot show no acute fracture.  There is evidence on the ankle x-rays of a nonunion of the medial malleolus       Assessment:     ICD-10-CM    1. Pain in right ankle and joints of right foot  M25.571 XR FOOT RIGHT (MIN 3 VIEWS) (CLINIC PERFORMED)     XR ANKLE RIGHT (MIN 3 VIEWS) (CLINIC PERFORMED)      2. 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 Ankle impingement syndrome, right  M25.871            Plan:     Treatment at this time:  1. Pain in right ankle and joints of right foot  -     XR FOOT RIGHT (MIN 3 VIEWS) (CLINIC PERFORMED); Future  -     XR ANKLE RIGHT (MIN 3 VIEWS) (CLINIC PERFORMED); Future  2. 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  He continues to have significant tenderness over the anterior ankle including significant point tenderness over the deltoid and medial malleoli are nonunion.    He continues to walk with a limp and continues to require the use of an ankle brace.    At this point, I feel a cortisone injection may be helpful to address his lateral ankle pain however I think this is  unlikely to help his medial ankle pain.  I continue to see a visible fracture line.  I feel this is likely  a nonunion.  I feel a repeat MRI or CAT scan would be reasonable to assess the avulsion fragment and to also evaluate the ongoing lateral ankle pain.  I feel would be reasonable to proceed with surgery which would include an excision of the ununited fragment as well as a repair of the deltoid ligament.  In addition I would recommend ankle arthroscopy with debridement.    At this point, I do not believe the patient is at maximum medical improvement.  To a reasonable degree of medical certainty, I feel that surgery would improve his function and his outcome.  3. Ankle impingement syndrome, right

## 2020-12-21 ENCOUNTER — Telehealth

## 2020-12-21 NOTE — Telephone Encounter (Signed)
PLEASE FAX MRI ORDER FOR RT ANKLE TO [phone] .

## 2020-12-24 NOTE — Telephone Encounter (Signed)
Sent to [facility] for clarification if this was a TOC or just IME.

## 2020-12-24 NOTE — Telephone Encounter (Signed)
We rec'd a call from [facility]. Please send over order for MRI. The ref number for the patient is [id number]. The fax number to send there order to is [phone].      Thanks

## 2020-12-24 NOTE — Telephone Encounter (Signed)
MRI ORDERED. LAMB OKAY WITH TOC.

## 2021-01-28 ENCOUNTER — Ambulatory Visit: Admit: 2021-01-28 | Discharge: 2021-01-28 | Payer: PRIVATE HEALTH INSURANCE | Attending: Orthopaedic Surgery

## 2021-01-28 DIAGNOSIS — S8254XK Nondisplaced fracture of medial malleolus of right tibia, subsequent encounter for closed fracture with nonunion: Secondary | ICD-10-CM

## 2021-01-28 NOTE — Progress Notes (Signed)
ORTHOPAEDICS    Name: Troy Mclean  Date of Birth: 12-07-1985  Gender: male  MRN: [id number]    CC: No chief complaint on file.       HPI:   Troy Mclean is a 35 y.o. male who presents with No chief complaint on file.  .  Troy Mclean was seen today as an IME.  He has previously seen Dr. Denyse Amass as well as Dr. Will Bonnet.  He was involved in a workplace accident on March 08, 2020.  He reports that he was climbing into a truck and got his boot stuck between the running board and the frame.  He then fell backwards and had a forced plantarflexed injury.  He was seen that day where x-rays were obtained.  I reviewed these x-rays today which show a nondisplaced medial malleolus fracture.  He was immobilized in a boot.  He has been through physical therapy 3 times.  He continues to have pain and swelling in the foot and point tenderness over the medial malleolus.Marland Kitchen    He was seen and for an IME on October 16, 2020.  He also previous had an MRI done in March 2022 which shows the area concerning for the nonunion.  He has not been able to return to work.    01/28/21    Continues to have ankle pain.  He returns today to review his recent MRI    I personally reviewed the MRI which shows evidence of ununited medial malleoli are fracture fragment with tearing of the deltoid ligament.  Unchanged ganglion cyst over dorsal aspect of the navicula.  Age-indeterminate mild to moderate medial and lateral ankle sprain.        ROS/Meds/PSH/PMH/FH/SH:   I personally reviewed the patients standard intake form.  Below are the pertinents    Tobacco:  reports that he has been smoking cigarettes. He started smoking about 20 years ago. He has never used smokeless tobacco.  Diabetes: No No results found for: LABA1C  No results found for: EAG   Allergies   Allergen Reactions    Lisinopril Other (See Comments)    Peanut-Containing Drug Products       Past Medical History:   Diagnosis Date    High blood pressure     Sleep apnea      No  past surgical history on file.     Current Outpatient Medications:     rizatriptan (MAXALT) 10 MG tablet, PLEASE SEE ATTACHED FOR DETAILED DIRECTIONS, Disp: , Rfl:     ibuprofen (ADVIL;MOTRIN) 800 MG tablet, , Disp: , Rfl:     amLODIPine (NORVASC) 10 MG tablet, 1 tablet Orally Once a day for 30 day(s), Disp: , Rfl:     gabapentin (NEURONTIN) 300 MG capsule, 1 capsule., Disp: , Rfl:     meloxicam (MOBIC) 15 MG tablet, 1 tablet Orally Once a day for 30 day(s), Disp: , Rfl:     tiZANidine (ZANAFLEX) 4 MG tablet, 1 tablet as needed Orally Three times a day, Disp: , Rfl:    Review of Systems     Physical Examination:  There were no vitals taken for this visit.   Mild antalgic gait  Well appearing male in no acute distress, alert and oriented x3, conversant with a normal affect. Responds to questions appropriately.  Appears stated age.   Head is normocephalic and atraumatic.   Pupils are equal and round bilaterally with anicteric sclera. EOMI.   Mucous membranes moist without ulceration. Oropharynx  clear without lesions.  Trachea is midline without thyromegaly. Neck is supple without lymphadenopathy.   Respirations are unlabored. No distal cyanosis or clubbing.  Pulses 2+ throughout extremities with RRR.  Skin is warm, dry, and well perfused. No rashes or lesions. Brisk capillary refill.     Right       Extremity examination    Ankle tenderness to palpation over the deltoid ligament, site of medial malleoli are nonunion.  In addition tenderness noted over the anterior lateral aspect of the ankle with positive ankle impingement sign.    Right ankle motion is reduced relative to left ankle    Skin over the foot and ankle is intact.    Neutral hindfoot alignment and moderate arch height.    Negative calcaneal squeeze, negative Tinel's, no pain over the posterior tibial tendon Achilles tendon or peroneal tendons.    Negative anterior drawer.    Negative Silverskiold sign    Ankle range of motion 5 degrees dorsiflexion and 20  degrees plantar flexion. Inversion 25 eversion 15 degrees.    5/ 5 plantar flexion, dorsiflexion, inversion, eversion.    Sensation intact to light touch.        Imaging:   XR ANKLE RIGHT (MIN 3 VIEWS) (CLINIC PERFORMED)  3 views right ankle show evidence of a medial malleoli are nonunion.  I   compared these views to the initial injury films in January.  There is no   evidence of bony bridging or callus formation.  XR FOOT RIGHT (MIN 3 VIEWS) (CLINIC PERFORMED)  3 views right foot show no acute fracture.  There is evidence on the ankle   x-rays of a nonunion of the medial malleolus       XR ANKLE RIGHT (MIN 3 VIEWS) (CLINIC PERFORMED)    Result Date: 12/11/2020  3 views right ankle show evidence of a medial malleoli are nonunion.  I compared these views to the initial injury films in January.  There is no evidence of bony bridging or callus formation.    XR FOOT RIGHT (MIN 3 VIEWS) (CLINIC PERFORMED)    Result Date: 12/11/2020  3 views right foot show no acute fracture.  There is evidence on the ankle x-rays of a nonunion of the medial malleolus       Assessment:     ICD-10-CM    1. 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 Abnormal MRI  R93.89       3. Ankle impingement syndrome, right  M25.871            Plan:     Treatment at this time:  1. 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  He continues to have significant tenderness over the anterior ankle including significant point tenderness over the deltoid and medial malleoli are nonunion.    He continues to walk with a limp and continues to require the use of an ankle brace.    At this point, I feel would be reasonable to proceed with surgery which would include an excision of the ununited fragment as well as a repair of the deltoid ligament.  In  addition I would recommend ankle arthroscopy with debridement.  We will assess the ganglion over TN joint at that time as well.  He will remain out of work until surgery.  2. Abnormal MRI  Overview:  IMPRESSION:    1. Unfused remote 11 x 5 x 3 mm avulsion of the medial malleolus, with  possible fibrous union, trace edema within this ossicle suggests mild bone  bruise (sagittal series S600 image 19). There is also a 5 x 3 mm ossicle  near the medial malleolus (sagittal image 19).    2. Age indeterminate mild to moderate medial and lateral ankle sprain. Low  grade plantar fasciitis.    3. Unchanged thin ganglion cyst over the dorsal aspect of the navicular  (sagittal image 40). Tendinosis of the Achilles tendon.    4. 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    3. Ankle impingement syndrome, right  Today we reviewed risks and benefits of surgery.  We discussed the risk of surgical complications including pain, nerve injury, vascular injury, infection, need for additional surgery, postoperative blood clots, risks associated with anesthesia including but not limited to death.  We addressed all questions at this time.    35 spent reviewing the record and discussing treatment plan

## 2021-01-28 NOTE — Assessment & Plan Note (Signed)
At this point, the patient appears to have failed conservative treatment for his right ankle injury.  He continues to have significant tenderness over the anterior ankle including significant point tenderness over the deltoid and medial malleoli are nonunion.    He continues to walk with a limp and continues to require the use of an ankle brace.    At this point, I feel would be reasonable to proceed with surgery which would include an excision of the ununited fragment as well as a repair of the deltoid ligament.  In addition I would recommend ankle arthroscopy with debridement.  We will assess the ganglion over TN joint at that time as well.     He will remain out of work until surgery.

## 2021-02-04 ENCOUNTER — Encounter

## 2021-02-05 ENCOUNTER — Encounter

## 2021-02-14 NOTE — Progress Notes (Signed)
Pre Procedure Patient Instructions    Procedure Location hospital:[facility]: [address] [place] - take a right at [address] and park in the Ambulatory Center on your right. Drop off at the door that says - Entrance to 2nd and 3rd floor - take elevator to 2nd floor and follow arrows to [address] for registration.    Procedure Date 02/20/21  Arrival Time Per Physicians Instructions     Medications:  Medication to be taken the morning of surgery with a few sips of water only: None  Do not take diet medications for 4 days prior to your procedure  Stop all supplements, vitamins and herbal remedies one week prior to your procedure, unless your doctor told you to continue taking.  Do not take over the counter pain medications except plain Tylenol or acetaminophen unless your doctor told you to do so.  If you are taking blood thinners, call the doctor performing your procedure and prescribing physician for instructions on when to stop.    Procedure Preparation    Diet Restrictions:No food or drink including gum or mints -after midnight    Skin Preparation:   Wash with Hibiclens or an antibacterial soap (e.g. Dial soap) the night before and morning of procedure.  Using a clean washcloth, wash from neck to toes for 3 minutes.Gently clean the area where your surgery will be done thoroughly  Do not use Hibiclens on or near your face, eyes, ears, or head  Do not put on any deodorants, lotions, powders, or oils afterwards. Be sure to put on clean clothing.    Other Preparation:  Call your surgeon right away if you get any wounds, cuts, scrapes, scabs, rashes, bug bites at or near your operative site.  Bowel prep as instructed by your doctor  Fleets enema as instructed by your doctor  No test required before surgery      Day of Procedure Patient Instruction:  Do not smoke, vape, chew tobacco, drink alcohol or use recreational drugs on the day of your procedure  Remove all jewelry, piercings and metal  accessories  Do not wear contacts, tampons, make-up, lotions, creams, powders, fragrances or deodorant  Do not bring valuables or money  Bring a picture ID and insurance card and any of the following that are applicable to you:    ?? Inhalers    ?? CPAP or BiPAP machine    ?? Remote for spinal cord stimulator or other implanted device    ?? Insulin pump supplies    ?? Walker or other orthopedic device necessary for postop    ?? Storage case for eyeglasses, hearing aids, dentures, etc    ?? A loose button-up shirt if instructed    ?? Copy of your Living Will and/or Medical Durable Power of Attorney    ?? List of current medications including name and dosage    .  If you are going home the same day as your procedure, a support person should accompany you to the facility and must transport you home.  If you plan to take public transportation of any sort, your support person must accompany you home.  You will need someone to stay with you for 24 hours after your procedure with sedation of any kind.       COVID Testing Instructions:  No COVID testing required as discussed.       Comments:     The information and visitor policy was reviewed with you during your Pre-Admission  Testing interview and you verbalized understanding. If you have any additional questions please contact [phone]    For financial questions regarding your procedure at a [facility] facility, please contact [phone], option 1

## 2021-02-18 NOTE — Telephone Encounter (Signed)
Arrival times will be provided as timely as possible.

## 2021-02-18 NOTE — Telephone Encounter (Signed)
PLEASE CALL WITH ARRIVAL TIME FOR SX ON 12/21 AND LOCATION  THANK YOU

## 2021-02-20 ENCOUNTER — Inpatient Hospital Stay: Payer: PRIVATE HEALTH INSURANCE

## 2021-02-20 MED ORDER — ROPIVACAINE HCL 5 MG/ML IJ SOLN
INTRAMUSCULAR | Status: AC
Start: 2021-02-20 — End: 2021-02-20
  Administered 2021-02-20: 16:00:00 15 via PERINEURAL

## 2021-02-20 MED ORDER — FENTANYL CITRATE (PF) 100 MCG/2ML IJ SOLN
100 MCG/2ML | INTRAMUSCULAR | Status: DC | PRN
Start: 2021-02-20 — End: 2021-02-20
  Administered 2021-02-20: 16:00:00 100 via INTRAVENOUS

## 2021-02-20 MED ORDER — NORMAL SALINE FLUSH 0.9 % IV SOLN
0.9 % | Freq: Two times a day (BID) | INTRAVENOUS | Status: DC
Start: 2021-02-20 — End: 2021-02-20

## 2021-02-20 MED ORDER — ROPIVACAINE HCL 2 MG/ML IJ SOLN
INTRAMUSCULAR | Status: AC
Start: 2021-02-20 — End: 2021-02-20

## 2021-02-20 MED ORDER — LACTATED RINGERS IV SOLN
INTRAVENOUS | Status: DC
Start: 2021-02-20 — End: 2021-02-20
  Administered 2021-02-20: 16:00:00 via INTRAVENOUS

## 2021-02-20 MED ORDER — DEXAMETHASONE SODIUM PHOSPHATE 4 MG/ML IJ SOLN
4 MG/ML | INTRAMUSCULAR | Status: DC | PRN
Start: 2021-02-20 — End: 2021-02-20
  Administered 2021-02-20: 18:00:00 8 via INTRAVENOUS

## 2021-02-20 MED ORDER — CAFFEINE CITRATE 60 MG/3ML IV SOLN
60 MG/3ML | INTRAVENOUS | Status: DC | PRN
Start: 2021-02-20 — End: 2021-02-20
  Administered 2021-02-20 (×2): 20 via INTRAVENOUS

## 2021-02-20 MED ORDER — ONDANSETRON HCL 4 MG PO TABS
4 MG | ORAL_TABLET | Freq: Three times a day (TID) | ORAL | 1 refills | Status: AC | PRN
Start: 2021-02-20 — End: ?

## 2021-02-20 MED ORDER — ROPIVACAINE HCL 2 MG/ML IJ SOLN
INTRAMUSCULAR | Status: AC
Start: 2021-02-20 — End: 2021-02-20
  Administered 2021-02-20: 16:00:00 15 via PERINEURAL

## 2021-02-20 MED ORDER — KETOROLAC TROMETHAMINE 30 MG/ML IJ SOLN
30 MG/ML | INTRAMUSCULAR | Status: DC | PRN
Start: 2021-02-20 — End: 2021-02-20
  Administered 2021-02-20: 18:00:00 30 via INTRAVENOUS

## 2021-02-20 MED ORDER — ONDANSETRON HCL 4 MG/2ML IJ SOLN
4 MG/2ML | INTRAMUSCULAR | Status: AC
Start: 2021-02-20 — End: ?

## 2021-02-20 MED ORDER — MIDAZOLAM HCL 2 MG/2ML IJ SOLN
2 MG/ML | INTRAMUSCULAR | Status: DC | PRN
Start: 2021-02-20 — End: 2021-02-20
  Administered 2021-02-20: 16:00:00 2 via INTRAVENOUS

## 2021-02-20 MED ORDER — HYDROCODONE-ACETAMINOPHEN 7.5-325 MG PO TABS
ORAL_TABLET | Freq: Four times a day (QID) | ORAL | 0 refills | Status: AC | PRN
Start: 2021-02-20 — End: 2021-02-25

## 2021-02-20 MED ORDER — MIDAZOLAM HCL 2 MG/2ML IJ SOLN
2 MG/ML | INTRAMUSCULAR | Status: AC
Start: 2021-02-20 — End: ?

## 2021-02-20 MED ORDER — DEXAMETHASONE SODIUM PHOSPHATE 4 MG/ML IJ SOLN
4 MG/ML | INTRAMUSCULAR | Status: AC
Start: 2021-02-20 — End: ?

## 2021-02-20 MED ORDER — CEFAZOLIN 3000 MG IN NS 100 ML IVPB
Status: AC
Start: 2021-02-20 — End: 2021-02-20
  Administered 2021-02-20: 18:00:00 3000 mg via INTRAVENOUS

## 2021-02-20 MED ORDER — MIDAZOLAM HCL 2 MG/2ML IJ SOLN
2 MG/ML | INTRAMUSCULAR | Status: AC
Start: 2021-02-20 — End: 2021-02-20

## 2021-02-20 MED ORDER — SODIUM CHLORIDE 0.9 % IV SOLN
0.9 % | INTRAVENOUS | Status: DC | PRN
Start: 2021-02-20 — End: 2021-02-20

## 2021-02-20 MED ORDER — HALOPERIDOL LACTATE 5 MG/ML IJ SOLN
5 MG/ML | Freq: Once | INTRAMUSCULAR | Status: DC | PRN
Start: 2021-02-20 — End: 2021-02-20

## 2021-02-20 MED ORDER — NORMAL SALINE FLUSH 0.9 % IV SOLN
0.9 % | INTRAVENOUS | Status: DC | PRN
Start: 2021-02-20 — End: 2021-02-20

## 2021-02-20 MED ORDER — KETOROLAC TROMETHAMINE 30 MG/ML IJ SOLN
30 MG/ML | INTRAMUSCULAR | Status: AC
Start: 2021-02-20 — End: ?

## 2021-02-20 MED ORDER — PROPOFOL 200 MG/20ML IV EMUL
200 MG/20ML | INTRAVENOUS | Status: AC
Start: 2021-02-20 — End: ?

## 2021-02-20 MED ORDER — MIDAZOLAM HCL 2 MG/2ML IJ SOLN
2 MG/ML | INTRAMUSCULAR | Status: DC | PRN
Start: 2021-02-20 — End: 2021-02-20
  Administered 2021-02-20: 17:00:00 2 via INTRAVENOUS

## 2021-02-20 MED ORDER — LABETALOL HCL 20 MG/4ML IV SOSY
20 MG/4ML | INTRAVENOUS | Status: DC | PRN
Start: 2021-02-20 — End: 2021-02-20

## 2021-02-20 MED ORDER — PROPOFOL 200 MG/20ML IV EMUL
200 MG/20ML | INTRAVENOUS | Status: DC | PRN
Start: 2021-02-20 — End: 2021-02-20
  Administered 2021-02-20: 18:00:00 300 via INTRAVENOUS

## 2021-02-20 MED ORDER — CAFFEINE CITRATE 60 MG/3ML IV SOLN
60 MG/3ML | INTRAVENOUS | Status: AC
Start: 2021-02-20 — End: 2021-02-20

## 2021-02-20 MED ORDER — FENTANYL CITRATE (PF) 100 MCG/2ML IJ SOLN
100 MCG/2ML | INTRAMUSCULAR | Status: AC
Start: 2021-02-20 — End: ?

## 2021-02-20 MED ORDER — CEFAZOLIN SODIUM-DEXTROSE 1-4 GM-%(50ML) IV SOLR
1-4-50 GM-%(50ML) | INTRAVENOUS | Status: DC
Start: 2021-02-20 — End: 2021-02-20

## 2021-02-20 MED ORDER — CEFAZOLIN SODIUM-DEXTROSE 2-3 GM-%(50ML) IV SOLR
2000 mg in Dextrose 3% | INTRAVENOUS | Status: DC
Start: 2021-02-20 — End: 2021-02-20

## 2021-02-20 MED ORDER — HYDROMORPHONE HCL 2 MG/ML IJ SOLN
2 MG/ML | INTRAMUSCULAR | Status: DC | PRN
Start: 2021-02-20 — End: 2021-02-20

## 2021-02-20 MED ORDER — FENTANYL CITRATE (PF) 100 MCG/2ML IJ SOLN
1002 MCG/2ML | INTRAMUSCULAR | Status: DC | PRN
Start: 2021-02-20 — End: 2021-02-20

## 2021-02-20 MED ORDER — ONDANSETRON HCL 4 MG/2ML IJ SOLN
4 MG/2ML | Freq: Once | INTRAMUSCULAR | Status: DC | PRN
Start: 2021-02-20 — End: 2021-02-20

## 2021-02-20 MED ORDER — FENTANYL CITRATE (PF) 100 MCG/2ML IJ SOLN
100 MCG/2ML | INTRAMUSCULAR | Status: DC | PRN
Start: 2021-02-20 — End: 2021-02-20
  Administered 2021-02-20: 17:00:00 100 via INTRAVENOUS

## 2021-02-20 MED ORDER — ROPIVACAINE HCL 5 MG/ML IJ SOLN
INTRAMUSCULAR | Status: AC
Start: 2021-02-20 — End: 2021-02-20

## 2021-02-20 MED ORDER — ONDANSETRON HCL 4 MG/2ML IJ SOLN
4 MG/2ML | INTRAMUSCULAR | Status: DC | PRN
Start: 2021-02-20 — End: 2021-02-20
  Administered 2021-02-20: 18:00:00 4 via INTRAVENOUS

## 2021-02-20 MED ORDER — LIDOCAINE HCL (PF) 2 % IJ SOLN
2 % | INTRAMUSCULAR | Status: DC | PRN
Start: 2021-02-20 — End: 2021-02-20
  Administered 2021-02-20: 18:00:00 100 via INTRAVENOUS

## 2021-02-20 MED ORDER — FENTANYL CITRATE (PF) 100 MCG/2ML IJ SOLN
100 MCG/2ML | INTRAMUSCULAR | Status: AC
Start: 2021-02-20 — End: 2021-02-20

## 2021-02-20 MED ORDER — ASPIRIN EC 325 MG PO TBEC
325 MG | ORAL_TABLET | Freq: Every day | ORAL | 0 refills | Status: AC
Start: 2021-02-20 — End: 2021-03-06

## 2021-02-20 MED ORDER — HYDRALAZINE HCL 20 MG/ML IJ SOLN
20 MG/ML | INTRAMUSCULAR | Status: DC | PRN
Start: 2021-02-20 — End: 2021-02-20

## 2021-02-20 MED ORDER — LIDOCAINE HCL 2 % IJ SOLN
2 % | INTRAMUSCULAR | Status: AC
Start: 2021-02-20 — End: ?

## 2021-02-20 MED FILL — LACTATED RINGERS IV SOLN: INTRAVENOUS | Qty: 500

## 2021-02-20 MED FILL — ROPIVACAINE HCL 2 MG/ML IJ SOLN: INTRAMUSCULAR | Qty: 40

## 2021-02-20 MED FILL — NAROPIN 5 MG/ML IJ SOLN: 5 MG/ML | INTRAMUSCULAR | Qty: 30

## 2021-02-20 MED FILL — CAFFEINE CITRATE 60 MG/3ML IV SOLN: 60 MG/3ML | INTRAVENOUS | Qty: 3

## 2021-02-20 MED FILL — FENTANYL CITRATE (PF) 100 MCG/2ML IJ SOLN: 100 MCG/2ML | INTRAMUSCULAR | Qty: 2

## 2021-02-20 MED FILL — DIPRIVAN 200 MG/20ML IV EMUL: 200 MG/20ML | INTRAVENOUS | Qty: 40

## 2021-02-20 MED FILL — MIDAZOLAM HCL 2 MG/2ML IJ SOLN: 2 MG/ML | INTRAMUSCULAR | Qty: 2

## 2021-02-20 MED FILL — CEFAZOLIN SODIUM-DEXTROSE 2-3 GM-%(50ML) IV SOLR: 2000 mg in Dextrose 3% | INTRAVENOUS | Qty: 50

## 2021-02-20 MED FILL — CEFAZOLIN SODIUM-DEXTROSE 1-4 GM-%(50ML) IV SOLR: 1-4 GM-%(50ML) | INTRAVENOUS | Qty: 50

## 2021-02-20 MED FILL — DEXAMETHASONE SODIUM PHOSPHATE 4 MG/ML IJ SOLN: 4 MG/ML | INTRAMUSCULAR | Qty: 2

## 2021-02-20 MED FILL — NORMAL SALINE FLUSH 0.9 % IV SOLN: 0.9 % | INTRAVENOUS | Qty: 40

## 2021-02-20 MED FILL — KETOROLAC TROMETHAMINE 30 MG/ML IJ SOLN: 30 MG/ML | INTRAMUSCULAR | Qty: 1

## 2021-02-20 MED FILL — ONDANSETRON HCL 4 MG/2ML IJ SOLN: 4 MG/2ML | INTRAMUSCULAR | Qty: 2

## 2021-02-20 MED FILL — LIDOCAINE HCL 2 % IJ SOLN: 2 % | INTRAMUSCULAR | Qty: 10

## 2021-02-20 NOTE — Anesthesia Procedure Notes (Signed)
Peripheral Block    Patient location during procedure: pre-op  Reason for block: post-op pain management  Start time: 02/20/2021 11:21 AM  End time: 02/20/2021 11:31 AM  Staffing  Performed: anesthesiologist   Anesthesiologist: Roland Earl, MD  Preanesthetic Checklist  Completed: patient identified, IV checked, site marked, risks and benefits discussed, surgical/procedural consents, equipment checked, pre-op evaluation, timeout performed, anesthesia consent given, oxygen available, monitors applied/VS acknowledged, fire risk safety assessment completed and verbalized and blood product R/B/A discussed and consented  Peripheral Block   Patient position: supine  Prep: ChloraPrep  Provider prep: mask  Patient monitoring: continuous pulse ox, IV access, oxygen and responsive to questions  Block type: Saphenous  Laterality: right  Injection technique: single-shot  Guidance: ultrasound guided    Needle   Needle type: short-bevel   Needle gauge: 20 G  Needle localization: anatomical landmarks and ultrasound guidance  Needle length: 8 cm  Assessment   Injection assessment: negative aspiration for heme, no paresthesia on injection, local visualized surrounding nerve on ultrasound and no intravascular symptoms  Paresthesia pain: none  Slow fractionated injection: yes  Hemodynamics: stable  Real-time [place] image taken/store: yes  Outcomes: uncomplicated and patient tolerated procedure well    Additional Notes  The surgeon has consulted the department of regional anesthesia to place a peripheral nerve block for post-operative pain management for this patient. If a PNB catheter is placed, infusion is anticipated for up to 72 hours post-operatively unless otherwise discussed.     Prior to the above procedure, the alternatives, risks, side effects, and potential complications (including anesthetic toxicity, seizures, death, temporary and/or permanent nerve injury, and loss of extremity function) of the planned single  injection and/or continuous peripheral nerve block. All were thoroughly discussed, and the patient and/or guardian acknowledge and accept all risks of the planned procedure.    Block completed under direct U/S guidance. Frequent negative aspiration during injection. No pain on injection, low injection pressure, no paresthesia, no evidence of hematoma, and no other complications unless otherwise noted.    PERFORMED BLOCK:  FEMORAL PERIPHERAL NERVE BLOCK      Medications Administered  ropivacaine (NAROPIN) injection 0.2% - Perineural   15 mL - 02/20/2021 11:21:00 AM  ropivacaine (NAROPIN) injection 0.5% - Perineural   15 mL - 02/20/2021 11:21:00 AM

## 2021-02-20 NOTE — Op Note (Signed)
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 Item Serial No. Manufacturer Lot No. LRB No. Used Action   ANCHOR LABRAL KNOTLESS 2.9MM X 8.25MM DBL LASSO - [id number]  ANCHOR LABRAL KNOTLESS 2.9MM X 8.25MM DBL LASSO  [facility] [id number] Right 2 Implanted         Drains: * No LDAs found *    Findings: On ankle arthroscopy, we were able to identify the ununited medial malleolus fragment.    Detailed Description of Procedure:   The correct patient was identified in the preoperative holding area. The informed consent was reviewed with the patient. We discussed all risks and benefits of the procedure including the risk of postoperative pain, nerve injury, bleeding, infection, blood clots, need for additional surgery, risks associated with anesthesia including but not limited to death. We answered all questions at this time.    Next the patient was transferred to the operating room and placed in a supine position.    Next the patient was induced under anesthesia.    Right lower extremity prepped draped sterile fashion surgical timeout was formed Exsanguinated right lower extremity inflating Tourniquet to 250.  We then injected normal saline into the in the medial tibiotalar joint portal    We establish the portal introduced the camera and identified the impinging soft  tissue in the medial, lateral gutter as well as anterior ankle    We established the lateral portal and used the shaver to debride the impinging soft tissue once this was complete we then evaluated in the medial malleolar fragment that appeared to be ununited.  I introduced a probe into the disc ankle and manipulated the fragment.  It was noted to be unstable in addition there was evidence of tearing of the deltoid ligament    We then made a separate incision over the medial malleolus dissecting down to the deltoid ligament.  This was elevated from its insertion.  We placed a suture tag in the deltoid    Next we exposed the ununited deltoid fragment and used a osteotome to remove the ununited fragment    Next we placed 2 anchors in the distal tibia, medial malleolus and then advanced the deltoid back to its origin.  This was oversewn with the extensor retinaculum.  Copiously irrigated and closed the medial incision in a layered fashion    Sterile dressing was applied patient was placed into a splint    Electronically signed by Baxter Hire, MD on 02/20/2021 at 5:37 PM

## 2021-02-20 NOTE — H&P (Signed)
Name: Troy Mclean  Date of Birth: Feb 01, 1986  Gender: male  MRN: [id number]     CC: Right ankle pain        HPI:   Troy Mclean is a 35 y.o. male who presents with right ankle pain  .  Troy Mclean was seen today as an IME.  He has previously seen Dr. Denyse Amass as well as Dr. Will Bonnet.  He was involved in a workplace accident on March 08, 2020.  He reports that he was climbing into a truck and got his boot stuck between the running board and the frame.  He then fell backwards and had a forced plantarflexed injury.  He was seen that day where x-rays were obtained.  I reviewed these x-rays today which show a nondisplaced medial malleolus fracture.  He was immobilized in a boot.  He has been through physical therapy 3 times.  He continues to have pain and swelling in the foot and point tenderness over the medial malleolus.Marland Kitchen    He was seen and for an IME on October 16, 2020.  He also previous had an MRI done in March 2022 which shows the area concerning for the nonunion.  He has not been able to return to work.     01/28/21     Continues to have ankle pain.  He returns today to review his recent MRI    I personally reviewed the MRI which shows evidence of ununited medial malleoli are fracture fragment with tearing of the deltoid ligament.  Unchanged ganglion cyst over dorsal aspect of the navicula.  Age-indeterminate mild to moderate medial and lateral ankle sprain.           ROS/Meds/PSH/PMH/FH/SH:   I personally reviewed the patients standard intake form.  Below are the pertinents     Tobacco:  reports that he has been smoking cigarettes. He started smoking about 20 years ago. He has never used smokeless tobacco.  Diabetes: No No results found for: LABA1C  No results found for: EAG        Allergies   Allergen Reactions    Lisinopril Other (See Comments)    Peanut-Containing Drug Products        Past Medical History        Past Medical History:   Diagnosis Date    High blood pressure      Sleep apnea           Past  Surgical History   No past surgical history on file.        Current Medication      Current Outpatient Medications:     rizatriptan (MAXALT) 10 MG tablet, PLEASE SEE ATTACHED FOR DETAILED DIRECTIONS, Disp: , Rfl:     ibuprofen (ADVIL;MOTRIN) 800 MG tablet, , Disp: , Rfl:     amLODIPine (NORVASC) 10 MG tablet, 1 tablet Orally Once a day for 30 day(s), Disp: , Rfl:     gabapentin (NEURONTIN) 300 MG capsule, 1 capsule., Disp: , Rfl:     meloxicam (MOBIC) 15 MG tablet, 1 tablet Orally Once a day for 30 day(s), Disp: , Rfl:     tiZANidine (ZANAFLEX) 4 MG tablet, 1 tablet as needed Orally Three times a day, Disp: , Rfl:       Review of Systems      Physical Examination:  There were no vitals taken for this visit.   Mild antalgic gait  Well appearing male in no acute distress, alert and oriented  x3, conversant with a normal affect. Responds to questions appropriately.  Appears stated age.   Head is normocephalic and atraumatic.   Pupils are equal and round bilaterally with anicteric sclera. EOMI.   Mucous membranes moist without ulceration. Oropharynx clear without lesions.  Trachea is midline without thyromegaly. Neck is supple without lymphadenopathy.   Respirations are unlabored. No distal cyanosis or clubbing.  Pulses 2+ throughout extremities with RRR.  Skin is warm, dry, and well perfused. No rashes or lesions. Brisk capillary refill.      Right        Extremity examination    Ankle tenderness to palpation over the deltoid ligament, site of medial malleoli are nonunion.  In addition tenderness noted over the anterior lateral aspect of the ankle with positive ankle impingement sign.    Right ankle motion is reduced relative to left ankle    Skin over the foot and ankle is intact.    Neutral hindfoot alignment and moderate arch height.    Negative calcaneal squeeze, negative Tinel's, no pain over the posterior tibial tendon Achilles tendon or peroneal tendons.    Negative anterior drawer.    Negative Silverskiold  sign    Ankle range of motion 5 degrees dorsiflexion and 20 degrees plantar flexion. Inversion 25 eversion 15 degrees.    5/ 5 plantar flexion, dorsiflexion, inversion, eversion.    Sensation intact to light touch.           Imaging:   XR ANKLE RIGHT (MIN 3 VIEWS) (CLINIC PERFORMED)  3 views right ankle show evidence of a medial malleoli are nonunion.  I   compared these views to the initial injury films in January.  There is no   evidence of bony bridging or callus formation.  XR FOOT RIGHT (MIN 3 VIEWS) (CLINIC PERFORMED)  3 views right foot show no acute fracture.  There is evidence on the ankle   x-rays of a nonunion of the medial malleolus        XR ANKLE RIGHT (MIN 3 VIEWS) (CLINIC PERFORMED)     Result Date: 12/11/2020  3 views right ankle show evidence of a medial malleoli are nonunion.  I compared these views to the initial injury films in January.  There is no evidence of bony bridging or callus formation.     XR FOOT RIGHT (MIN 3 VIEWS) (CLINIC PERFORMED)     Result Date: 12/11/2020  3 views right foot show no acute fracture.  There is evidence on the ankle x-rays of a nonunion of the medial malleolus        Assessment:       ICD-10-CM     1. Closed nondisplaced fracture of medial malleolus of right tibia with nonunion  S82.54XK         2. Abnormal MRI  R93.89         3. Ankle impingement syndrome, right  M25.871               Plan:      Treatment at this time:  1. 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  He continues to have significant tenderness over the anterior ankle including significant point tenderness over the deltoid and medial malleoli are nonunion.     He continues to walk with a limp and continues to require the  use of an ankle brace.     At this point, I feel would be reasonable to proceed with surgery which would include an  excision of the ununited fragment as well as a repair of the deltoid ligament.  In addition I would recommend ankle arthroscopy with debridement.  We will assess the ganglion over TN joint at that time as well.      He will remain out of work until surgery.  2. Abnormal MRI  Overview:  IMPRESSION:     1. Unfused remote 11 x 5 x 3 mm avulsion of the medial malleolus, with  possible fibrous union, trace edema within this ossicle suggests mild bone  bruise (sagittal series S600 image 19). There is also a 5 x 3 mm ossicle  near the medial malleolus (sagittal image 19).     2. Age indeterminate mild to moderate medial and lateral ankle sprain. Low  grade plantar fasciitis.     3. Unchanged thin ganglion cyst over the dorsal aspect of the navicular  (sagittal image 40). Tendinosis of the Achilles tendon.     4. 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     3. Ankle impingement syndrome, right  Today we reviewed risks and benefits of surgery.  We discussed the risk of surgical complications including pain, nerve injury, vascular injury, infection, need for additional surgery, postoperative blood clots, risks associated with anesthesia including but not limited to death.  We addressed all questions at this time.

## 2021-02-20 NOTE — Discharge Instructions (Addendum)
Troy Rainwater, MD  LOWER EXTREMITY SURGERY--POSTOPERATIVE INSTRUCTIONS  Pain Control:  You may or may not have had a nerve block depending on type of surgery. This will likely wear off in 12-36 hours. You may have increased pain as the block wears off. Please take your prescribed pain medication as directed with food prior to the nerve block wearing off.   Stay ahead of the pain.   Take prescription medication as directed  If there is no Tylenol (acetaminophen) in the prescribed pain medication, you may takeup to Tylenol 3g/ 24 hours, in addition to the prescribed narcotic.  Over the counter anti-inflammatory medication such as Advil or Aleve can be used to supplement narcotic pain medication as needed.  Narcotic pain medication can cause constipation.  Please take a stool softener while taking narcotics and drink plenty of water.  Please plan ahead! if you are running out of your pain medication prior to your followup appointment, please call during business hours with a 2-3 day notice. Perscription refills or changes cannot be addressed after normal business hours or on weekends.  Narcotics will be tapered after   Icing:  Ice pack/bag to lower extremity 20 minutes at a time, as much as you like, while awake.  Activity:  Elevate lower extremity above the heart to decrease swelling and pain.  ( X ) Non-weight bearing with crutches  (   ) Touch down weight bearing  (   ) Partial weight bearing  (   ) Full weight bearing as tolerated with crutches as needed  Dressing/Splint:  ( X ) Leave splint on and keep clean/dry until follow up appointment  (   ) If no splint, change dressing 48 hours post surgery  You may shower and get the wounds wet, once there is no wound drainage  Do not submerge lower extremity in water or scrub incision sites  Cover wounds with dry band aids following dressing change or shower  Physical Therapy/Activity:  Will be scheduled at your follow up visit if indicated  ( X ) Begin active motion of  knee, ankle, toes to minimize swelling  Follow Up Appointment:  Call office for follow up appointment to be scheduled 1-2 weeks from date of surgery, if not already scheduled  Call office for temperature greater than 102 degrees Fahrenheit and for excessive swelling, pain, or redness around incision sites         General Information:    -You may experience lightheadedness, forgetfulness, dizziness, sleepiness, headache, nausea, or sore throat following surgery.     -For any emergencies call 911    -Your reflexes will be diminished after receiving anesthetic drugs     Do not drive or operative heavy machinery for 24 hrs   Do not drink any alcohol or smoke for 24 hrs   Avoid making any important decision for 24 hrs   Do not stay alone for the next 24 hrs    Diet/Fluids    -Start off with a light first meal (no greasy or spicy food)    Activity    - You are advised to go home and restrict your activities for the rest of the day    Medications    -If you develop a fever (over 101*), chills, active bleeding, excessive swelling or nausea and vomiting past the 24hr period call your doctor.     Follow up care: Call the office if you don't already have an appointment scheduled.

## 2021-02-20 NOTE — Anesthesia Procedure Notes (Signed)
Peripheral Block    Patient location during procedure: pre-op  Reason for block: post-op pain management  Start time: 02/20/2021 11:21 AM  End time: 02/20/2021 11:31 AM  Staffing  Performed: anesthesiologist   Anesthesiologist: Roland Earl, MD  Preanesthetic Checklist  Completed: patient identified, IV checked, site marked, risks and benefits discussed, surgical/procedural consents, equipment checked, pre-op evaluation, timeout performed, anesthesia consent given, oxygen available, monitors applied/VS acknowledged, fire risk safety assessment completed and verbalized and blood product R/B/A discussed and consented  Peripheral Block   Prep: Betadine  Provider prep: mask  Patient monitoring: continuous pulse ox, oxygen and responsive to questions  Block type: Sciatic  Popliteal  Laterality: right  Injection technique: single-shot  Guidance: ultrasound guided    Needle   Needle type: short-bevel   Needle gauge: 20 G  Needle localization: anatomical landmarks and ultrasound guidance  Needle length: 8 cm  Assessment   Injection assessment: negative aspiration for heme, no paresthesia on injection, local visualized surrounding nerve on ultrasound and no intravascular symptoms  Paresthesia pain: none  Slow fractionated injection: yes  Hemodynamics: stable  Real-time [place] image taken/store: yes  Outcomes: uncomplicated    Additional Notes  The surgeon has consulted the department of regional anesthesia to place a peripheral nerve block for post-operative pain management for this patient. If a PNB catheter is placed, infusion is anticipated for up to 72 hours post-operatively unless otherwise discussed.     Prior to the above procedure, the alternatives, risks, side effects, and potential complications (including anesthetic toxicity, seizures, death, temporary and/or permanent nerve injury, and loss of extremity function) of the planned single injection and/or continuous peripheral nerve block. All were thoroughly  discussed, and the patient and/or guardian acknowledge and accept all risks of the planned procedure.    Block completed under direct U/S guidance. Frequent negative aspiration during injection. No pain on injection, low injection pressure, no paresthesia, no evidence of hematoma, and no other complications unless otherwise noted.    PERFORMED BLOCK:  SCIATIC PERIPHERAL NERVE BLOCK      Medications Administered  ropivacaine (NAROPIN) injection 0.2% - Perineural   15 mL - 02/20/2021 11:21:00 AM  ropivacaine (NAROPIN) injection 0.5% - Perineural   15 mL - 02/20/2021 11:21:00 AM

## 2021-02-20 NOTE — Anesthesia Post-Procedure Evaluation (Signed)
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    Aldrete Phase I: Aldrete Score: 9    Aldrete Phase II:        Anesthesia Post Evaluation    Patient location during evaluation: PACU  Level of consciousness: sleepy but conscious  Airway patency: patent  Nausea & Vomiting: no nausea and no vomiting  Complications: no  Cardiovascular status: hemodynamically stable  Respiratory status: acceptable, nonlabored ventilation and spontaneous ventilation  Hydration status: euvolemic  Comments: Vital signs in PACU reviewed by anesthesia provider and documented in Handoff.  Multimodal analgesia pain management approach

## 2021-02-20 NOTE — Anesthesia Pre-Procedure Evaluation (Signed)
Department of Anesthesiology  Preprocedure Note       Name:  Troy Mclean   Age:  35 y.o.  DOB:  [birth year]                                          MRN:  [id number]         Date:  02/20/2021      Surgeon: Moishe Spice):  Baxter Hire, MD    Procedure: Procedure(s):  RIGHT ANKLE ARTHROSCOPY WITH DELTOID LIGAMENT RECONSTRUCTION, PARTIAL EXCISION BONE DISTAL TIBIA, POSSIBLE EXCISION MASS TALONAVICULAR JOINT    Medications prior to admission:   Prior to Admission medications    Medication Sig Start Date End Date Taking? Authorizing Provider   HYDROcodone-acetaminophen (NORCO) 7.5-325 MG per tablet Take 2 tablets by mouth every 6 hours as needed for Pain for up to 5 days. Intended supply: 5 days. Take lowest dose possible to manage pain Max Daily Amount: 8 tablets 02/20/21 02/25/21 Yes Diana Eves, PA   ondansetron (ZOFRAN) 4 MG tablet Take 1 tablet by mouth 3 times daily as needed for Nausea or Vomiting 02/20/21  Yes Diana Eves, PA   aspirin 325 MG EC tablet Take 1 tablet by mouth daily for 14 days Take daily in the morning with food to prevent blood clot formation. 02/20/21 03/06/21 Yes Diana Eves, PA   amoxicillin-clavulanate (AUGMENTIN) 875-125 MG per tablet Augmentin Take 1 tablet (oral) 2 times per day for 10 days [id number] tablet 2 times per day oral 10 days suspended 875-125 mg 08/19/17   Historical Provider, MD   benzonatate (TESSALON) 200 MG capsule TAKE 1 CAPSULE BY MOUTH THREE TIMES A DAY FOR 7 DAYS  Patient not taking: Reported on 02/20/2021 12/06/20   Historical Provider, MD   butalbital-acetaminophen-caffeine (FIORICET, ESGIC) 50-325-40 MG per tablet Take 1 tablet by mouth every 4 hours as needed for Migraine  Patient not taking: Reported on 02/20/2021 12/08/19   Historical Provider, MD   celecoxib (CELEBREX) 200 MG capsule  01/04/20   Historical Provider, MD   ipratropium (ATROVENT) 0.06 % nasal spray USE 2 SPRAYS IN EACH NOSTRIL 3 TIMES A DAY FOR 4 DAYS 12/06/20   Historical Provider,  MD   ibuprofen (ADVIL;MOTRIN) 800 MG tablet  09/19/20   Historical Provider, MD   amLODIPine (NORVASC) 10 MG tablet Take 10 mg by mouth at bedtime    [facility] Reconciliation, MD   gabapentin (NEURONTIN) 300 MG capsule Take 3 capsules by mouth at bedtime.    [facility] Automatic Reconciliation, MD   meloxicam (MOBIC) 15 MG tablet 1 tablet Orally Once a day for 30 day(s)    [facility] Automatic Reconciliation, MD       Current medications:    Current Facility-Administered Medications   Medication Dose Route Frequency Provider Last Rate Last Admin   ??? ceFAZolin (ANCEF) 3000 mg in sodium chloride 0.9% 100 mL IVPB  3,000 mg IntraVENous On Call to OR Diana Eves, PA       ??? lactated ringers infusion   IntraVENous Continuous Diana Eves, PA 125 mL/hr at 02/20/21 1055 New Bag at 02/20/21 1055   ??? sodium chloride flush 0.9 % injection 5-40 mL  5-40 mL IntraVENous 2 times per day Diana Eves, PA       ??? sodium chloride flush 0.9 % injection 5-40 mL  5-40 mL IntraVENous PRN Diana Eves, PA       ??? 0.9 % sodium chloride infusion   IntraVENous PRN Diana Eves, PA       ??? ceFAZolin (ANCEF) 1-4 GM-%(50ML) IVPB (duplex) SOLR            ??? ceFAZolin (ANCEF) 2000 mg in Dextrose 3% IVPB (duplex)              Facility-Administered Medications Ordered in Other Encounters   Medication Dose Route Frequency Provider Last Rate Last Admin   ??? midazolam (VERSED) injection   IntraVENous PRN Roland Earl, MD   2 mg at 02/20/21 1121   ??? fentaNYL (SUBLIMAZE) injection   IntraVENous PRN Roland Earl, MD   100 mcg at 02/20/21 1121       Allergies:    Allergies   Allergen Reactions   ??? Lisinopril Other (See Comments)     Pancreatitis   ??? Peanut (Diagnostic) Hives, Itching and Rash       Problem List:    Patient Active Problem List   Diagnosis Code   ??? 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 Ankle impingement syndrome, right M25.871   ???  Abnormal MRI R93.89   ??? Back disorder M53.9   ??? Hypertension I10   ??? Infective otitis externa H60.399   ??? 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 Depression with anxiety    ??? GERD (gastroesophageal reflux disease)     Diet Controlled   ??? High blood pressure    ??? Hx of blood clots     Multiple superficial blood clots Right lower leg after back surgery   ??? Migraine    ??? Morbid obesity with BMI of 45.0-49.9, adult (HCC)    ??? OSA on CPAP    ??? Seasonal allergies        Past Surgical History:        Procedure Laterality Date   ??? BACK SURGERY  2019    T11-T12 posterior fusion, L5-S1 Discectomy   ??? BACK SURGERY  2016    Laminectomy   ??? CHOLECYSTECTOMY     ??? KNEE ARTHROSCOPY Right 2002   ??? TOTAL KNEE ARTHROPLASTY Right 2020       Social History:    Social History     Tobacco Use   ??? Smoking status: Every Day     Packs/day: 1.00     Years: 27.00     Pack years: 27.00     Types: Cigarettes     Start date: 1995     Passive exposure: Current   ??? Smokeless tobacco: Never   Substance Use Topics   ??? Alcohol use: Not Currently                                Ready to quit: No  Counseling given: Not Answered      Vital Signs (Current):   Vitals:    02/14/21 1212 02/20/21 1029 02/20/21 1115 02/20/21 1135   BP:  (!) 139/92     Pulse:  76     Resp:  16     Temp:  98.2 ??F (36.8 ??C)     TempSrc:  Oral     SpO2:  95% 94% 94%  Weight: (!) 337 lb (152.9 kg) (!) 329 lb 9.4 oz (149.5 kg)     Height: 5\' 9"  (1.753 m) 5\' 9"  (1.753 m)                                                BP Readings from Last 3 Encounters:   02/20/21 (!) 139/92       NPO Status: Time of last liquid consumption: 0000                        Time of last solid consumption: 0000                        Date of last liquid consumption: 02/19/21                        Date of last solid food consumption: 02/19/21    BMI:   Wt Readings from Last 3 Encounters:   02/20/21 (!) 329 lb 9.4 oz (149.5 kg)   01/28/21 (!) 333 lb 3.2 oz (151.1  kg)     Body mass index is 48.67 kg/m??.    CBC: No results found for: WBC, RBC, HGB, HCT, MCV, RDW, PLT    CMP: No results found for: NA, K, CL, CO2, BUN, CREATININE, GFRAA, AGRATIO, LABGLOM, GLUCOSE, GLU, PROT, CALCIUM, BILITOT, ALKPHOS, AST, ALT    POC Tests: No results for input(s): POCGLU, POCNA, POCK, POCCL, POCBUN, POCHEMO, POCHCT in the last 72 hours.    Coags: No results found for: PROTIME, INR, APTT    HCG (If Applicable): No results found for: PREGTESTUR, PREGSERUM, HCG, HCGQUANT     ABGs: No results found for: PHART, PO2ART, PCO2ART, HCO3ART, BEART, O2SATART     Type & Screen (If Applicable):  No results found for: LABABO, LABRH    Drug/Infectious Status (If Applicable):  No results found for: HIV, HEPCAB    COVID-19 Screening (If Applicable): No results found for: COVID19        Anesthesia Evaluation  Nursing notes reviewed  Airway: Mallampati: II          Dental:          Pulmonary: breath sounds clear to auscultation  (+) sleep apnea:  current smoker                           Cardiovascular:    (+) hypertension:,         Rhythm: regular  Rate: normal                    Neuro/Psych:   (+) headaches:, depression/anxiety             GI/Hepatic/Renal:   (+) GERD:, morbid obesity          Endo/Other: Negative Endo/Other ROS                    Abdominal:             Vascular: negative vascular ROS.         Other Findings:           Anesthesia Plan      general     ASA 3       Induction:  intravenous.    MIPS: Postoperative opioids intended and Prophylactic antiemetics administered.  Anesthetic plan and risks discussed with patient.        Attending anesthesiologist reviewed and agrees with Preprocedure content      Post-op pain plan if not by surgeon: single peripheral nerve block            Esther Hardy, MD   02/20/2021

## 2021-03-01 NOTE — Telephone Encounter (Signed)
Patient's Name  Troy Mclean  Date of Birth  22-Jan-1986  Caller Name  Alani Lacivita  Callback Number  [phone]  Physician Name  Dr. Randa Lynn  Message Required  Patient had surgery on 02/20/21. Patient can feel heat radiating through his cast, his ankle is sensitive and he is in pain. Patient would like a call back to discuss what to do next.  Source  Patient Call

## 2021-03-06 ENCOUNTER — Ambulatory Visit
Admit: 2021-03-06 | Discharge: 2021-03-06 | Payer: PRIVATE HEALTH INSURANCE | Attending: Surgical | Primary: Physician Assistant

## 2021-03-06 NOTE — Progress Notes (Signed)
ORTHOPAEDICS      SUBJECTIVE        Patient ID: Troy Mclean is a 36 y.o. male     CHIEF COMPLAINT     CC: Date of surgery: 02/20/2021         - s/p right ankle arthroscopy with Deltoid ligament reconstruction and partial excision of distal tibia    HISTORY OF PRESENT ILLNESS    Patient presents for his initial postoperative visit.  Doing well postoperatively, pain is improving.  Patient has been compliant with his nonweightbearing status on the right ankle.    PAST MEDICAL HISTORY     Past Medical History:   Diagnosis Date    Depression with anxiety     GERD (gastroesophageal reflux disease)     Diet Controlled    High blood pressure     Hx of blood clots     Multiple superficial blood clots Right lower leg after back surgery    Migraine     Morbid obesity with BMI of 45.0-49.9, adult (HCC)     OSA on CPAP     Seasonal allergies        SURGICAL HISTORY       Past Surgical History:   Procedure Laterality Date    ANKLE ARTHROSCOPY Right 02/20/2021    RIGHT ANKLE ARTHROSCOPY WITH DELTOID LIGAMENT RECONSTRUCTION, PARTIAL EXCISION BONE DISTAL TIBIA performed by Baxter Hire, MD at [facility]    BACK SURGERY  2019    T11-T12 posterior fusion, L5-S1 Discectomy    BACK SURGERY  2016    Laminectomy    CHOLECYSTECTOMY      KNEE ARTHROSCOPY Right 2002    TOTAL KNEE ARTHROPLASTY Right 2020       CURRENT MEDICATIONS       Previous Medications    AMLODIPINE (NORVASC) 10 MG TABLET    Take 10 mg by mouth at bedtime    AMOXICILLIN-CLAVULANATE (AUGMENTIN) 875-125 MG PER TABLET    Augmentin Take 1 tablet (oral) 2 times per day for 10 days [id number] tablet 2 times per day oral 10 days suspended 875-125 mg    ASPIRIN 325 MG EC TABLET    Take 1 tablet by mouth daily for 14 days Take daily in the morning with food to prevent blood clot formation.    BENZONATATE (TESSALON) 200 MG CAPSULE    TAKE 1 CAPSULE BY MOUTH THREE TIMES A DAY FOR 7 DAYS    BUTALBITAL-ACETAMINOPHEN-CAFFEINE (FIORICET, ESGIC)  50-325-40 MG PER TABLET    Take 1 tablet by mouth every 4 hours as needed for Migraine    CELECOXIB (CELEBREX) 200 MG CAPSULE        GABAPENTIN (NEURONTIN) 300 MG CAPSULE    Take 3 capsules by mouth at bedtime.    IBUPROFEN (ADVIL;MOTRIN) 800 MG TABLET        IPRATROPIUM (ATROVENT) 0.06 % NASAL SPRAY    USE 2 SPRAYS IN EACH NOSTRIL 3 TIMES A DAY FOR 4 DAYS    MELOXICAM (MOBIC) 15 MG TABLET    1 tablet Orally Once a day for 30 day(s)    ONDANSETRON (ZOFRAN) 4 MG TABLET    Take 1 tablet by mouth 3 times daily as needed for Nausea or Vomiting       ALLERGIES     Lisinopril and Peanut (diagnostic)    FAMILY HISTORY     No family history on file.     SOCIAL HISTORY  Social History     Socioeconomic History    Marital status: Married   Tobacco Use    Smoking status: Every Day     Packs/day: 1.00     Years: 27.00     Pack years: 27.00     Types: Cigarettes     Start date: 1995     Passive exposure: Current    Smokeless tobacco: Never   Vaping Use    Vaping Use: Former   Substance and Sexual Activity    Alcohol use: Not Currently    Drug use: Never       OBJECTIVE     Examination of the right ankle shows incisions clean, dry and intact.  Incisions well-healed, no active drainage or sign of infection.  Swelling diffusely, ankle well aligned.  Neurovascularly intact.    ASSESSMENT      1. Ankle impingement syndrome, right    2. 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 Orthopedic aftercare      PLAN   Sutures removed, Steri-Strips placed.  Fitted with dry dressing and cam walker boot.  Nonweightbearing on the right lower extremity, boot full-time except for hygiene.  Patient may begin progressive weightbearing in the boot 4 weeks postop.  Begin physical therapy at 4 weeks postop.  Patient will remain out of work at this time.  Follow-up in 4 weeks.  All of his questions were answered, call the office with questions or concerns.

## 2021-03-08 NOTE — Telephone Encounter (Signed)
DIANA

## 2021-03-13 NOTE — Telephone Encounter (Signed)
[employer] note written on 1/4 by Jae Dire has been emailed as requested.

## 2021-03-13 NOTE — Telephone Encounter (Signed)
THE PATIENT MISPLACED THE LETTER EXCUSING HIM FROM [employer] AND ASKED THAT YOU E-MAIL ANOTHER ONE .  PLEASE E-MAIL THE LETTER TO E-MAIL ADDRESS [email or web address]

## 2021-03-20 NOTE — Telephone Encounter (Signed)
Patient wanted to confirm when he could start walking in his boot and/or transition to tennis shoes. Based off of the last office note he may begin progressively walking in his boot now. I informed him that at his next visit he will be given additional info regarding transitioning into tennis shoes.

## 2021-03-20 NOTE — Telephone Encounter (Signed)
The patient is requesting a phone call regarding questions about boot.

## 2021-04-01 ENCOUNTER — Ambulatory Visit
Admit: 2021-04-01 | Discharge: 2021-04-01 | Payer: PRIVATE HEALTH INSURANCE | Attending: Surgical | Primary: Physician Assistant

## 2021-04-01 DIAGNOSIS — M25871 Other specified joint disorders, right ankle and foot: Secondary | ICD-10-CM

## 2021-04-01 NOTE — Progress Notes (Signed)
ORTHOPAEDICS      SUBJECTIVE        Patient ID: Troy Mclean is a 36 y.o. male     CHIEF COMPLAINT     CC: Date of surgery:02/20/2021         - s/p right ankle arthroscopy with Deltoid ligament reconstruction and partial excision of distal tibia    HISTORY OF PRESENT ILLNESS    Patient presents for follow up for her right ankle. 6 weeks out from his date of surgery. Doing well postoperatively. Patient states he is doing very well. He states he has had no pain or issues since surgery and reports his sensation/feeling in his lower extremity have even improved compared to his baseline prior to surgery.    PAST MEDICAL HISTORY     Past Medical History:   Diagnosis Date    Depression with anxiety     GERD (gastroesophageal reflux disease)     Diet Controlled    High blood pressure     Hx of blood clots     Multiple superficial blood clots Right lower leg after back surgery    Migraine     Morbid obesity with BMI of 45.0-49.9, adult (HCC)     OSA on CPAP     Seasonal allergies        SURGICAL HISTORY       Past Surgical History:   Procedure Laterality Date    ANKLE ARTHROSCOPY Right 02/20/2021    RIGHT ANKLE ARTHROSCOPY WITH DELTOID LIGAMENT RECONSTRUCTION, PARTIAL EXCISION BONE DISTAL TIBIA performed by Baxter Hire, MD at [facility]    BACK SURGERY  2019    T11-T12 posterior fusion, L5-S1 Discectomy    BACK SURGERY  2016    Laminectomy    CHOLECYSTECTOMY      KNEE ARTHROSCOPY Right 2002    TOTAL KNEE ARTHROPLASTY Right 2020       CURRENT MEDICATIONS       Previous Medications    AMLODIPINE (NORVASC) 10 MG TABLET    Take 10 mg by mouth at bedtime    AMOXICILLIN-CLAVULANATE (AUGMENTIN) 875-125 MG PER TABLET    Augmentin Take 1 tablet (oral) 2 times per day for 10 days [id number] tablet 2 times per day oral 10 days suspended 875-125 mg    ASPIRIN 325 MG EC TABLET    Take 1 tablet by mouth daily for 14 days Take daily in the morning with food to prevent blood clot formation.     BENZONATATE (TESSALON) 200 MG CAPSULE    TAKE 1 CAPSULE BY MOUTH THREE TIMES A DAY FOR 7 DAYS    BUTALBITAL-ACETAMINOPHEN-CAFFEINE (FIORICET, ESGIC) 50-325-40 MG PER TABLET    Take 1 tablet by mouth every 4 hours as needed for Migraine    CELECOXIB (CELEBREX) 200 MG CAPSULE        GABAPENTIN (NEURONTIN) 300 MG CAPSULE    Take 3 capsules by mouth at bedtime.    IBUPROFEN (ADVIL;MOTRIN) 800 MG TABLET        IPRATROPIUM (ATROVENT) 0.06 % NASAL SPRAY    USE 2 SPRAYS IN EACH NOSTRIL 3 TIMES A DAY FOR 4 DAYS    MELOXICAM (MOBIC) 15 MG TABLET    1 tablet Orally Once a day for 30 day(s)    ONDANSETRON (ZOFRAN) 4 MG TABLET    Take 1 tablet by mouth 3 times daily as needed for Nausea or Vomiting       ALLERGIES  Lisinopril and Peanut (diagnostic)    FAMILY HISTORY       Family History   Problem Relation Age of Onset    Hypertension Mother     Diabetes Father     Other Cancer Paternal Uncle         SOCIAL HISTORY       Social History     Socioeconomic History    Marital status: Married   Tobacco Use    Smoking status: Every Day     Packs/day: 1.00     Years: 27.00     Pack years: 27.00     Types: Cigarettes     Start date: 1995     Passive exposure: Current    Smokeless tobacco: Never    Tobacco comments:     03/06/2020   Vaping Use    Vaping Use: Former   Substance and Sexual Activity    Alcohol use: Not Currently    Drug use: Never       OBJECTIVE     Examination of the right ankle shows incisions clean, dry and intact.  Incisions well-healed, no active drainage or sign of infection. Swelling diffusely, ankle well aligned.  Ankle is stable to anterior drawer sign. Passive dorsiflexion to neutral position, 30 degrees plantarflexion. Neurovascularly intact.    ASSESSMENT      1. Ankle impingement syndrome, right    2. 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 Orthopedic aftercare      PLAN   Continue progressive weightbearing as tolerated in the boot. In 2 weeks he may transition  out of the boot to ASO brace and supportive shoe. Continue physical therapy 2-3x/week x 6 weeks. Remain out of work at this time. Follow up in 4 weeks. All questions answered, call the office with questions or concerns.

## 2021-04-10 NOTE — Telephone Encounter (Signed)
Informed Troy Mclean that we are in a different office everyday and therefore the fax may be in a different office. Provided my personal fax so I can sign and return promptly.

## 2021-04-10 NOTE — Telephone Encounter (Signed)
Berline Lopes ERMIE [phone] FAX [phone]    STATED THAT SHE SENT A SCRIPT DATED 04/05/21 FOR PATIENT FOR Troy EDEN, PA TO SIGN. SHE IS CALLING TO SEE IF IT WAS RECEIVED AND ON STATUS OF SIGNATURE AND RETURNING IT. PLEASE CALL. THANK YOU.

## 2021-04-26 ENCOUNTER — Ambulatory Visit
Admit: 2021-04-26 | Discharge: 2021-04-26 | Payer: PRIVATE HEALTH INSURANCE | Attending: Surgical | Primary: Physician Assistant

## 2021-04-26 DIAGNOSIS — M25871 Other specified joint disorders, right ankle and foot: Secondary | ICD-10-CM

## 2021-04-26 NOTE — Progress Notes (Signed)
ORTHOPAEDICS      SUBJECTIVE        Patient ID: Troy Mclean is a 36 y.o. male     CHIEF COMPLAINT     CC: Date of surgery: 02/20/2021         - s/p right ankle arthroscopy with Deltoid ligament reconstruction and partial excision of distal tibia       HISTORY OF PRESENT ILLNESS    Patient presents for follow up for his right ankle. 9 weeks out from his date of surgery. Doing well at today's visit.  Patient has been improving with physical therapy.  Working on his range of motion exercises on a daily basis.  He does complain of some residual stiffness and tightness in the ankle.  Ankle is more stiff when he first gets up in the morning.  Denies sensation of ankle instability.  Patient has used to [employer] while at physical therapy and says that he has a device significantly helped his symptoms.  Patient is eager to return to work.    PAST MEDICAL HISTORY     Past Medical History:   Diagnosis Date    Depression with anxiety     GERD (gastroesophageal reflux disease)     Diet Controlled    High blood pressure     Hx of blood clots     Multiple superficial blood clots Right lower leg after back surgery    Migraine     Morbid obesity with BMI of 45.0-49.9, adult (HCC)     OSA on CPAP     Seasonal allergies        SURGICAL HISTORY       Past Surgical History:   Procedure Laterality Date    ANKLE ARTHROSCOPY Right 02/20/2021    RIGHT ANKLE ARTHROSCOPY WITH DELTOID LIGAMENT RECONSTRUCTION, PARTIAL EXCISION BONE DISTAL TIBIA performed by Baxter Hire, MD at [facility]    BACK SURGERY  2019    T11-T12 posterior fusion, L5-S1 Discectomy    BACK SURGERY  2016    Laminectomy    CHOLECYSTECTOMY      KNEE ARTHROSCOPY Right 2002    TOTAL KNEE ARTHROPLASTY Right 2020       CURRENT MEDICATIONS       Previous Medications    AMLODIPINE (NORVASC) 10 MG TABLET    Take 10 mg by mouth at bedtime    AMOXICILLIN-CLAVULANATE (AUGMENTIN) 875-125 MG PER TABLET    Augmentin Take 1 tablet (oral) 2 times  per day for 10 days [id number] tablet 2 times per day oral 10 days suspended 875-125 mg    ASPIRIN 325 MG EC TABLET    Take 1 tablet by mouth daily for 14 days Take daily in the morning with food to prevent blood clot formation.    BENZONATATE (TESSALON) 200 MG CAPSULE    TAKE 1 CAPSULE BY MOUTH THREE TIMES A DAY FOR 7 DAYS    BUTALBITAL-ACETAMINOPHEN-CAFFEINE (FIORICET, ESGIC) 50-325-40 MG PER TABLET    Take 1 tablet by mouth every 4 hours as needed for Migraine    CELECOXIB (CELEBREX) 200 MG CAPSULE        GABAPENTIN (NEURONTIN) 300 MG CAPSULE    Take 3 capsules by mouth at bedtime.    IBUPROFEN (ADVIL;MOTRIN) 800 MG TABLET        IPRATROPIUM (ATROVENT) 0.06 % NASAL SPRAY    USE 2 SPRAYS IN EACH NOSTRIL 3 TIMES A DAY FOR 4 DAYS    MELOXICAM (MOBIC)  15 MG TABLET    1 tablet Orally Once a day for 30 day(s)    ONDANSETRON (ZOFRAN) 4 MG TABLET    Take 1 tablet by mouth 3 times daily as needed for Nausea or Vomiting       ALLERGIES     Lisinopril and Peanut (diagnostic)    FAMILY HISTORY       Family History   Problem Relation Age of Onset    Hypertension Mother     Diabetes Father     Other Cancer Paternal Uncle         SOCIAL HISTORY       Social History     Socioeconomic History    Marital status: Married   Tobacco Use    Smoking status: Every Day     Packs/day: 1.00     Years: 27.00     Pack years: 27.00     Types: Cigarettes     Start date: 1995     Passive exposure: Current    Smokeless tobacco: Never    Tobacco comments:     03/06/2020   Vaping Use    Vaping Use: Former   Substance and Sexual Activity    Alcohol use: Not Currently    Drug use: Never       OBJECTIVE     Examination of the right ankle shows incisions clean, dry and intact.  Incisions well-healed, no active drainage or sign of infection. Swelling diffusely, ankle well aligned.  Minimal tenderness over the medial gutter and deltoid ligament.  Ankle is stable to anterior drawer sign. Passive dorsiflexion to neutral position, 30 degrees plantarflexion.   Contralateral side shows 5 degrees passive ankle dorsiflexion.  Neurovascularly intact.     ASSESSMENT      1. Ankle impingement syndrome, right    2. 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 Orthopedic aftercare      PLAN   Patient presents for follow-up for his right ankle, patient is 9 weeks out from his date of surgery.  Continue weightbearing as tolerated with supportive shoe wear.  Referral for work hardening for 3 times a week for the next 2 weeks.  If patient does well with work hardening he he would like to go back to work full duty in the next month.  Recommend use of Flexinator to improve ankle dorsiflexion, referral for DME has previously been submitted and signed off on and is considered medically necessary.  Overall patient is doing well and continues to improve.  All of his questions were answered, call the office with questions or concerns.  Sedentary duty only at work.  Follow-up in 4 weeks.

## 2021-04-29 ENCOUNTER — Encounter: Payer: PRIVATE HEALTH INSURANCE | Attending: Surgical | Primary: Physician Assistant

## 2021-04-30 NOTE — Addendum Note (Signed)
Addended by: Sarina Ser on: 04/30/2021 02:41 PM     Modules accepted: Orders

## 2021-05-20 ENCOUNTER — Telehealth

## 2021-05-20 NOTE — Telephone Encounter (Signed)
Troy Mclean CALLED TO CHECK THE STATUS ON THE DME EQUIPMENT, PLEASE CALL.

## 2021-05-20 NOTE — Telephone Encounter (Signed)
Per Kate;s OV on 2/24:"Recommend use of Flexinator to improve ankle dorsiflexion, referral for DME has previously been submitted and signed off on and is considered medically necessary."

## 2021-05-24 ENCOUNTER — Ambulatory Visit
Admit: 2021-05-24 | Discharge: 2021-05-24 | Payer: PRIVATE HEALTH INSURANCE | Attending: Surgical | Primary: Physician Assistant

## 2021-05-24 DIAGNOSIS — M25871 Other specified joint disorders, right ankle and foot: Secondary | ICD-10-CM

## 2021-05-24 NOTE — Progress Notes (Signed)
ORTHOPAEDICS      SUBJECTIVE       Patient ID: Troy Mclean is a 36 y.o. male     CHIEF COMPLAINT     CC: Date of surgery: 02/20/2021         - s/p right ankle arthroscopy with Deltoid ligament reconstruction and partial excision of distal tibia         - work comp    HISTORY OF PRESENT ILLNESS    Patient presents for follow up visit for his right ankle. 3 months out from his date of surgery. Patient is slowly improving, improved with physical therapy. He has been working on his range of motion exercises.   He has completed physical therapy. Denies sensation of ankle instability. He does have some residual soreness and increased pain with more aggressive weightbearing activity.    REVIEW OF SYSTEMS     General/Constitutional:  Shakes Denies. Fever denies. Chills denies.  Endocrine:  Cold intolerance denies. Excessive thirst denies. Frequent urination denies.  Respiratory:  Shortness of breath denies. Cough denies. Wheezing denies.  Cardiovascular:  Chest pain at rest denies. Cyanosis denies. Dizziness denies. Irregular heartbeat denies. Hypertension admits.  Gastrointestinal:  Abdominal pain denies. Diarrhea denies. Nausea denies.Vomiting denies.  Peripheral Vascular:  Absent pulses in feet denies. Cold extremities denies. Ulceration of feet denies.  Neurologic:  Balance difficulty denies. Headaches denies. Seizures denies.  Psychiatric:  Anxiety denies. Delusions denies. Depressed mood denies.    PAST MEDICAL HISTORY     Past Medical History:   Diagnosis Date    Depression with anxiety     GERD (gastroesophageal reflux disease)     Diet Controlled    High blood pressure     Hx of blood clots     Multiple superficial blood clots Right lower leg after back surgery    Migraine     Morbid obesity with BMI of 45.0-49.9, adult (HCC)     OSA on CPAP     Seasonal allergies        SURGICAL HISTORY       Past Surgical History:   Procedure Laterality Date    ANKLE ARTHROSCOPY Right 02/20/2021    RIGHT  ANKLE ARTHROSCOPY WITH DELTOID LIGAMENT RECONSTRUCTION, PARTIAL EXCISION BONE DISTAL TIBIA performed by Baxter Hire, MD at [facility]    BACK SURGERY  2019    T11-T12 posterior fusion, L5-S1 Discectomy    BACK SURGERY  2016    Laminectomy    CHOLECYSTECTOMY      KNEE ARTHROSCOPY Right 2002    TOTAL KNEE ARTHROPLASTY Right 2020       CURRENT MEDICATIONS       Previous Medications    AMLODIPINE (NORVASC) 10 MG TABLET    Take 10 mg by mouth at bedtime    AMOXICILLIN-CLAVULANATE (AUGMENTIN) 875-125 MG PER TABLET    Augmentin Take 1 tablet (oral) 2 times per day for 10 days [id number] tablet 2 times per day oral 10 days suspended 875-125 mg    ASPIRIN 325 MG EC TABLET    Take 1 tablet by mouth daily for 14 days Take daily in the morning with food to prevent blood clot formation.    BENZONATATE (TESSALON) 200 MG CAPSULE    TAKE 1 CAPSULE BY MOUTH THREE TIMES A DAY FOR 7 DAYS    BUTALBITAL-ACETAMINOPHEN-CAFFEINE (FIORICET, ESGIC) 50-325-40 MG PER TABLET    Take 1 tablet by mouth every 4 hours as needed for Migraine  CELECOXIB (CELEBREX) 200 MG CAPSULE        GABAPENTIN (NEURONTIN) 300 MG CAPSULE    Take 3 capsules by mouth at bedtime.    IBUPROFEN (ADVIL;MOTRIN) 800 MG TABLET        IPRATROPIUM (ATROVENT) 0.06 % NASAL SPRAY    USE 2 SPRAYS IN EACH NOSTRIL 3 TIMES A DAY FOR 4 DAYS    MELOXICAM (MOBIC) 15 MG TABLET    1 tablet Orally Once a day for 30 day(s)    ONDANSETRON (ZOFRAN) 4 MG TABLET    Take 1 tablet by mouth 3 times daily as needed for Nausea or Vomiting       ALLERGIES     Lisinopril and Peanut (diagnostic)    FAMILY HISTORY       Family History   Problem Relation Age of Onset    Hypertension Mother     Diabetes Father     Other Cancer Paternal Uncle         SOCIAL HISTORY       Social History     Socioeconomic History    Marital status: Married   Tobacco Use    Smoking status: Every Day     Packs/day: 1.00     Years: 27.00     Pack years: 27.00     Types: Cigarettes     Start date: 1995      Passive exposure: Current    Smokeless tobacco: Never    Tobacco comments:     03/06/2020   Vaping Use    Vaping Use: Former   Substance and Sexual Activity    Alcohol use: Not Currently    Drug use: Never       OBJECTIVE     Well developed, well nourished male patient sitting comfortably in his chair. Awake, alert, and oriented. Answers questions appropriately, no acute distress. Normal affect. Pupils are equal and round bilaterally, anicteric sclera. Mucous membranes moist without ulceration. Respirations are unlabored, no distal cyanosis or clubbing. Sensation intact diffusely about the foot and ankle, palpable dorsalis pedis pulse. Neurovascularly intact, fires all motor groups 5/5. No evidence of open wound, skin breakdown, or sign of infection. Skin is intact. Capillary refill less than 2 seconds.    Examination of the right ankle shows incisions clean, dry and intact.  Incisions well-healed, no active drainage or sign of infection. Swelling diffusely, ankle well aligned.  No tenderness over the medial gutter and deltoid ligament.  Ankle is stable to anterior drawer sign. 0 to 5 degrees passive ankle dorsiflexion, 30 degrees plantarflexion.  Contralateral side shows 5 degrees passive ankle dorsiflexion.  Neurovascularly intact.    ASSESSMENT      1. Ankle impingement syndrome, right    2. 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 Orthopedic aftercare      PLAN   Patient presents for follow up for his right ankle. Continues to improve postoperatively, patient is eager to return to full duty at work. Patient will follow up with Dr. Randa Lynn next week for maximum medical improvement final visit and to be released to full duty. Continue NSAIDs as needed. Sedentary duty only at work until released to full duty by Dr. Randa Lynn. All of his questions were answered, call the office with questions or concerns.

## 2021-05-27 ENCOUNTER — Encounter
Admit: 2021-05-27 | Discharge: 2021-05-27 | Payer: PRIVATE HEALTH INSURANCE | Attending: Orthopaedic Surgery | Primary: Physician Assistant

## 2021-05-27 DIAGNOSIS — R9389 Abnormal findings on diagnostic imaging of other specified body structures: Secondary | ICD-10-CM

## 2021-05-27 NOTE — Progress Notes (Signed)
ORTHOPAEDICS      SUBJECTIVE       Patient ID: Troy Mclean is a 36 y.o. male     CHIEF COMPLAINT     CC: Date of surgery: 02/20/2021         - s/p right ankle arthroscopy with Deltoid ligament reconstruction and partial excision of distal tibia         - work comp    HISTORY OF PRESENT ILLNESS    Patient presents for follow up visit for his right ankle. 3 months out from his date of surgery. Patient is slowly improving, improved with physical therapy. He has been working on his range of motion exercises.   He has completed physical therapy. Denies sensation of ankle instability. He does have some residual soreness and increased pain with more aggressive weightbearing activity.    05/27/21    He is doing better.  He continues to have decreased ROM right ankle compared to left ankle.     He also says he some discomfort on a ladder and difficulty running.     REVIEW OF SYSTEMS     General/Constitutional:  Shakes Denies. Fever denies. Chills denies.  Endocrine:  Cold intolerance denies. Excessive thirst denies. Frequent urination denies.  Respiratory:  Shortness of breath denies. Cough denies. Wheezing denies.  Cardiovascular:  Chest pain at rest denies. Cyanosis denies. Dizziness denies. Irregular heartbeat denies. Hypertension admits.  Gastrointestinal:  Abdominal pain denies. Diarrhea denies. Nausea denies.Vomiting denies.  Peripheral Vascular:  Absent pulses in feet denies. Cold extremities denies. Ulceration of feet denies.  Neurologic:  Balance difficulty denies. Headaches denies. Seizures denies.  Psychiatric:  Anxiety denies. Delusions denies. Depressed mood denies.    PAST MEDICAL HISTORY     Past Medical History:   Diagnosis Date    Depression with anxiety     GERD (gastroesophageal reflux disease)     Diet Controlled    High blood pressure     Hx of blood clots     Multiple superficial blood clots Right lower leg after back surgery    Migraine     Morbid obesity with BMI of 45.0-49.9,  adult (HCC)     OSA on CPAP     Seasonal allergies        SURGICAL HISTORY       Past Surgical History:   Procedure Laterality Date    ANKLE ARTHROSCOPY Right 02/20/2021    RIGHT ANKLE ARTHROSCOPY WITH DELTOID LIGAMENT RECONSTRUCTION, PARTIAL EXCISION BONE DISTAL TIBIA performed by Baxter HireJoshua H Revin Corker, MD at [facility]    BACK SURGERY  2019    T11-T12 posterior fusion, L5-S1 Discectomy    BACK SURGERY  2016    Laminectomy    CHOLECYSTECTOMY      KNEE ARTHROSCOPY Right 2002    TOTAL KNEE ARTHROPLASTY Right 2020       CURRENT MEDICATIONS       Previous Medications    ACETAMINOPHEN (TYLENOL) 500 MG TABLET    Take 1,000 mg by mouth as needed    AMLODIPINE (NORVASC) 10 MG TABLET    Take 10 mg by mouth at bedtime    AMOXICILLIN-CLAVULANATE (AUGMENTIN) 875-125 MG PER TABLET    Augmentin Take 1 tablet (oral) 2 times per day for 10 days [id number] tablet 2 times per day oral 10 days suspended 875-125 mg    ASPIRIN 325 MG EC TABLET    Take 1 tablet by mouth daily for 14 days Take daily  in the morning with food to prevent blood clot formation.    BENZONATATE (TESSALON) 200 MG CAPSULE    TAKE 1 CAPSULE BY MOUTH THREE TIMES A DAY FOR 7 DAYS    BUTALBITAL-ACETAMINOPHEN-CAFFEINE (FIORICET, ESGIC) 50-325-40 MG PER TABLET    Take 1 tablet by mouth every 4 hours as needed for Migraine    CELECOXIB (CELEBREX) 200 MG CAPSULE        DOXYCYCLINE HYCLATE (VIBRA-TABS) 100 MG TABLET    TAKE 1 TABLET BY MOUTH TWICE A DAY FOR 7 DAYS    FEXOFENADINE (ALLEGRA) 180 MG TABLET    TAKE 1 TABLET BY MOUTH EVERY DAY SWALLOW WHOLE WITH WATER DO NOT TAKE WITH FRUIT JUICES    FEXOFENADINE (ALLEGRA) 180 MG TABLET    TAKE 1 TABLET BY MOUTH EVERY DAY SWALLOW WHOLE WITH WATER DO NOT TAKE WITH FRUIT JUICES    GABAPENTIN (NEURONTIN) 300 MG CAPSULE    Take 3 capsules by mouth at bedtime.    GABAPENTIN (NEURONTIN) 800 MG TABLET        IBUPROFEN (ADVIL;MOTRIN) 800 MG TABLET        IPRATROPIUM (ATROVENT) 0.06 % NASAL SPRAY    USE 2 SPRAYS IN EACH  NOSTRIL 3 TIMES A DAY FOR 4 DAYS    MELOXICAM (MOBIC) 15 MG TABLET    1 tablet Orally Once a day for 30 day(s)    MELOXICAM (MOBIC) 7.5 MG TABLET    TAKE 1 TABLET BY MOUTH EVERY DAY FOR 30 DAYS    ONDANSETRON (ZOFRAN) 4 MG TABLET    Take 1 tablet by mouth 3 times daily as needed for Nausea or Vomiting       ALLERGIES     Lisinopril, Peanut (diagnostic), and Peanut-containing drug products    FAMILY HISTORY       Family History   Problem Relation Age of Onset    Hypertension Mother     Diabetes Father     Other Cancer Paternal Uncle         SOCIAL HISTORY       Social History     Socioeconomic History    Marital status: Married     Spouse name: None    Number of children: None    Years of education: None    Highest education level: None   Tobacco Use    Smoking status: Every Day     Packs/day: 1.00     Years: 27.00     Pack years: 27.00     Types: Cigarettes     Start date: 1995     Passive exposure: Current    Smokeless tobacco: Never    Tobacco comments:     03/06/2020   Vaping Use    Vaping Use: Former   Substance and Sexual Activity    Alcohol use: Not Currently    Drug use: Never       OBJECTIVE     Well developed, well nourished male patient sitting comfortably in his chair. Awake, alert, and oriented. Answers questions appropriately, no acute distress. Normal affect. Pupils are equal and round bilaterally, anicteric sclera. Mucous membranes moist without ulceration. Respirations are unlabored, no distal cyanosis or clubbing. Sensation intact diffusely about the foot and ankle, palpable dorsalis pedis pulse. Neurovascularly intact, fires all motor groups 5/5. No evidence of open wound, skin breakdown, or sign of infection. Skin is intact. Capillary refill less than 2 seconds.    Examination of the right ankle shows incisions clean, dry and  intact.  Incisions well-healed, no active drainage or sign of infection. Swelling diffusely, ankle well aligned.  No tenderness over the medial gutter and deltoid ligament.       Ankle is stable to anterior drawer sign.     Right ankle DF 5, PF 20   Left ankle DF 20, PF 30     Neurovascularly intact.    ASSESSMENT      1. Abnormal MRI    2. 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 Ankle impingement syndrome, right        PLAN     Mr. Polan has completed work hardening.     He continues to have some difficulties with right ankle due to loss of ROM.     Based on loss of ROM of ankle, table 17-11, he has a "mild" loss of motion resulting in 3% whole person, 7% LE, and 10% foot impairment.    In the future, he may require physical therapy, ankle injections, ankle brace, ASO.    36 min were spent on this encounter.

## 2021-05-28 NOTE — Telephone Encounter (Signed)
Notes were printed and given to Troy Mclean directly in the office yesterday with the exception of the note from 3/27 as it was not yet complete following his visit. He was instructed to return to the office for a copy of 3/27 office note to be printed should he want it as well.

## 2021-05-28 NOTE — Telephone Encounter (Signed)
Patient states that he was told by Dr Randa Lynn that he could pick up copies of [facility] report and Dr Evlyn Clines notes.

## 2021-06-10 NOTE — Telephone Encounter (Signed)
Called in states that he does need in the last note as previously requested.

## 2021-06-10 NOTE — Telephone Encounter (Signed)
Spoke with patient who states that he believes he has everything he needs. He will call me back directly If he needs anything else.

## 2021-07-22 NOTE — Telephone Encounter (Signed)
I spoke with Troy Mclean and he explains that he twisted his ankle slightly this morning. The initial pain has improved though is still sore to the touch. He will come see Anda Kraft this Friday in [place] for his ankle. The injury is no longer covered by [facility].

## 2021-07-22 NOTE — Telephone Encounter (Signed)
States his ankle popped this morning and now having sharp stabbing pain when he moves from side to side. Please call. Thank you

## 2021-07-26 ENCOUNTER — Encounter: Attending: Surgical | Primary: Physician Assistant
